# Patient Record
Sex: Female | Born: 1965 | Race: White | Hispanic: No | Smoking: Former smoker
Health system: Southern US, Community
[De-identification: ages and names within clinical notes are randomized; demographics above are authoritative.]

## PROBLEM LIST (undated history)

## (undated) DIAGNOSIS — A4902 Methicillin resistant Staphylococcus aureus infection, unspecified site: Secondary | ICD-10-CM

## (undated) DIAGNOSIS — E43 Unspecified severe protein-calorie malnutrition: Secondary | ICD-10-CM

## (undated) DIAGNOSIS — F1120 Opioid dependence, uncomplicated: Secondary | ICD-10-CM

## (undated) DIAGNOSIS — K7460 Unspecified cirrhosis of liver: Secondary | ICD-10-CM

## (undated) DIAGNOSIS — M4622 Osteomyelitis of vertebra, cervical region: Secondary | ICD-10-CM

## (undated) DIAGNOSIS — B182 Chronic viral hepatitis C: Secondary | ICD-10-CM

---

## 2011-08-29 DIAGNOSIS — B182 Chronic viral hepatitis C: Secondary | ICD-10-CM | POA: Diagnosis present

## 2020-09-15 DIAGNOSIS — U071 COVID-19: Secondary | ICD-10-CM | POA: Insufficient documentation

## 2021-07-10 ENCOUNTER — Inpatient Hospital Stay (HOSPITAL_COMMUNITY)
Admission: EM | Admit: 2021-07-10 | Discharge: 2021-07-19 | DRG: 539 | Disposition: A | Discharge status: Skilled Nursing Facility | Payer: Self-pay | Source: Skilled Nursing Facility | Attending: Internal Medicine | Admitting: Internal Medicine

## 2021-07-10 DIAGNOSIS — K766 Portal hypertension: Secondary | ICD-10-CM | POA: Diagnosis present

## 2021-07-10 DIAGNOSIS — F1990 Other psychoactive substance use, unspecified, uncomplicated: Secondary | ICD-10-CM | POA: Diagnosis present

## 2021-07-10 DIAGNOSIS — M4622 Osteomyelitis of vertebra, cervical region: Secondary | ICD-10-CM | POA: Diagnosis present

## 2021-07-10 DIAGNOSIS — Z8672 Personal history of thrombophlebitis: Secondary | ICD-10-CM

## 2021-07-10 DIAGNOSIS — R7881 Bacteremia: Secondary | ICD-10-CM | POA: Diagnosis present

## 2021-07-10 DIAGNOSIS — M8448XA Pathological fracture, other site, initial encounter for fracture: Secondary | ICD-10-CM | POA: Diagnosis present

## 2021-07-10 DIAGNOSIS — B9562 Methicillin resistant Staphylococcus aureus infection as the cause of diseases classified elsewhere: Secondary | ICD-10-CM | POA: Diagnosis present

## 2021-07-10 DIAGNOSIS — K7469 Other cirrhosis of liver: Secondary | ICD-10-CM | POA: Diagnosis present

## 2021-07-10 DIAGNOSIS — Z87891 Personal history of nicotine dependence: Secondary | ICD-10-CM

## 2021-07-10 DIAGNOSIS — R140 Abdominal distension (gaseous): Secondary | ICD-10-CM

## 2021-07-10 DIAGNOSIS — N179 Acute kidney failure, unspecified: Secondary | ICD-10-CM | POA: Diagnosis present

## 2021-07-10 DIAGNOSIS — Z66 Do not resuscitate: Secondary | ICD-10-CM | POA: Diagnosis present

## 2021-07-10 DIAGNOSIS — M4626 Osteomyelitis of vertebra, lumbar region: Principal | ICD-10-CM | POA: Diagnosis present

## 2021-07-10 DIAGNOSIS — E43 Unspecified severe protein-calorie malnutrition: Secondary | ICD-10-CM | POA: Diagnosis present

## 2021-07-10 DIAGNOSIS — K7290 Hepatic failure, unspecified without coma: Secondary | ICD-10-CM

## 2021-07-10 DIAGNOSIS — K7031 Alcoholic cirrhosis of liver with ascites: Secondary | ICD-10-CM | POA: Diagnosis present

## 2021-07-10 DIAGNOSIS — L8990 Pressure ulcer of unspecified site, unspecified stage: Secondary | ICD-10-CM | POA: Insufficient documentation

## 2021-07-10 DIAGNOSIS — M545 Low back pain, unspecified: Secondary | ICD-10-CM

## 2021-07-10 DIAGNOSIS — Z791 Long term (current) use of non-steroidal anti-inflammatories (NSAID): Secondary | ICD-10-CM

## 2021-07-10 DIAGNOSIS — L89151 Pressure ulcer of sacral region, stage 1: Secondary | ICD-10-CM | POA: Diagnosis not present

## 2021-07-10 DIAGNOSIS — Z79899 Other long term (current) drug therapy: Secondary | ICD-10-CM

## 2021-07-10 DIAGNOSIS — K6812 Psoas muscle abscess: Secondary | ICD-10-CM | POA: Diagnosis present

## 2021-07-10 DIAGNOSIS — M4640 Discitis, unspecified, site unspecified: Secondary | ICD-10-CM | POA: Diagnosis present

## 2021-07-10 DIAGNOSIS — K7460 Unspecified cirrhosis of liver: Secondary | ICD-10-CM

## 2021-07-10 DIAGNOSIS — Z6821 Body mass index (BMI) 21.0-21.9, adult: Secondary | ICD-10-CM

## 2021-07-10 DIAGNOSIS — M4649 Discitis, unspecified, multiple sites in spine: Secondary | ICD-10-CM | POA: Diagnosis present

## 2021-07-10 DIAGNOSIS — R188 Other ascites: Secondary | ICD-10-CM

## 2021-07-10 DIAGNOSIS — Z20822 Contact with and (suspected) exposure to covid-19: Secondary | ICD-10-CM | POA: Diagnosis present

## 2021-07-10 DIAGNOSIS — D61818 Other pancytopenia: Secondary | ICD-10-CM | POA: Diagnosis present

## 2021-07-10 DIAGNOSIS — B182 Chronic viral hepatitis C: Secondary | ICD-10-CM | POA: Diagnosis present

## 2021-07-10 DIAGNOSIS — R6510 Systemic inflammatory response syndrome (SIRS) of non-infectious origin without acute organ dysfunction: Secondary | ICD-10-CM

## 2021-07-10 DIAGNOSIS — D649 Anemia, unspecified: Secondary | ICD-10-CM | POA: Diagnosis present

## 2021-07-10 DIAGNOSIS — F319 Bipolar disorder, unspecified: Secondary | ICD-10-CM | POA: Diagnosis present

## 2021-07-10 DIAGNOSIS — E871 Hypo-osmolality and hyponatremia: Secondary | ICD-10-CM | POA: Diagnosis present

## 2021-07-10 HISTORY — DX: Opioid dependence, uncomplicated: F11.20

## 2021-07-10 HISTORY — DX: Methicillin resistant Staphylococcus aureus infection, unspecified site: A49.02

## 2021-07-10 HISTORY — DX: Unspecified cirrhosis of liver: K74.60

## 2021-07-10 HISTORY — DX: Osteomyelitis of vertebra, cervical region: M46.22

## 2021-07-10 HISTORY — DX: Chronic viral hepatitis C: B18.2

## 2021-07-10 HISTORY — DX: Unspecified severe protein-calorie malnutrition: E43

## 2021-07-11 ENCOUNTER — Emergency Department (HOSPITAL_COMMUNITY): Payer: Self-pay

## 2021-07-11 ENCOUNTER — Encounter (HOSPITAL_COMMUNITY): Payer: Self-pay

## 2021-07-11 ENCOUNTER — Other Ambulatory Visit: Payer: Self-pay

## 2021-07-11 ENCOUNTER — Inpatient Hospital Stay (HOSPITAL_COMMUNITY): Payer: Self-pay

## 2021-07-11 DIAGNOSIS — A419 Sepsis, unspecified organism: Secondary | ICD-10-CM

## 2021-07-11 DIAGNOSIS — M4642 Discitis, unspecified, cervical region: Secondary | ICD-10-CM

## 2021-07-11 DIAGNOSIS — B9562 Methicillin resistant Staphylococcus aureus infection as the cause of diseases classified elsewhere: Secondary | ICD-10-CM

## 2021-07-11 DIAGNOSIS — N179 Acute kidney failure, unspecified: Secondary | ICD-10-CM

## 2021-07-11 DIAGNOSIS — F1990 Other psychoactive substance use, unspecified, uncomplicated: Secondary | ICD-10-CM

## 2021-07-11 DIAGNOSIS — M4646 Discitis, unspecified, lumbar region: Secondary | ICD-10-CM

## 2021-07-11 DIAGNOSIS — R7881 Bacteremia: Secondary | ICD-10-CM

## 2021-07-11 DIAGNOSIS — K6812 Psoas muscle abscess: Secondary | ICD-10-CM

## 2021-07-11 DIAGNOSIS — K7031 Alcoholic cirrhosis of liver with ascites: Secondary | ICD-10-CM

## 2021-07-11 DIAGNOSIS — R6520 Severe sepsis without septic shock: Secondary | ICD-10-CM

## 2021-07-11 DIAGNOSIS — M4640 Discitis, unspecified, site unspecified: Secondary | ICD-10-CM | POA: Diagnosis present

## 2021-07-11 HISTORY — DX: Other psychoactive substance use, unspecified, uncomplicated: F19.90

## 2021-07-11 HISTORY — DX: Methicillin resistant Staphylococcus aureus infection as the cause of diseases classified elsewhere: B95.62

## 2021-07-11 LAB — CBC WITH DIFFERENTIAL/PLATELET
Abs Immature Granulocytes: 0.02 10*3/uL (ref 0.00–0.07)
Basophils Absolute: 0 10*3/uL (ref 0.0–0.1)
Basophils Relative: 1 %
Eosinophils Absolute: 0.1 10*3/uL (ref 0.0–0.5)
Eosinophils Relative: 3 %
HCT: 24.1 % — ABNORMAL LOW (ref 36.0–46.0)
Hemoglobin: 7.7 g/dL — ABNORMAL LOW (ref 12.0–15.0)
Immature Granulocytes: 1 %
Lymphocytes Relative: 22 %
Lymphs Abs: 0.6 10*3/uL — ABNORMAL LOW (ref 0.7–4.0)
MCH: 33.5 pg (ref 26.0–34.0)
MCHC: 32 g/dL (ref 30.0–36.0)
MCV: 104.8 fL — ABNORMAL HIGH (ref 80.0–100.0)
Monocytes Absolute: 0.2 10*3/uL (ref 0.1–1.0)
Monocytes Relative: 6 %
Neutro Abs: 1.9 10*3/uL (ref 1.7–7.7)
Neutrophils Relative %: 67 %
Platelets: 120 10*3/uL — ABNORMAL LOW (ref 150–400)
RBC: 2.3 MIL/uL — ABNORMAL LOW (ref 3.87–5.11)
RDW: 20.9 % — ABNORMAL HIGH (ref 11.5–15.5)
WBC: 2.9 10*3/uL — ABNORMAL LOW (ref 4.0–10.5)
nRBC: 0 % (ref 0.0–0.2)

## 2021-07-11 LAB — FOLATE: Folate: 16.4 ng/mL (ref >5.9)

## 2021-07-11 LAB — URINALYSIS, ROUTINE W REFLEX MICROSCOPIC
Bacteria, UA: NONE SEEN
Bilirubin Urine: NEGATIVE
Glucose, UA: NEGATIVE mg/dL
Hgb urine dipstick: NEGATIVE
Ketones, ur: NEGATIVE mg/dL
Leukocytes,Ua: NEGATIVE
Nitrite: NEGATIVE
Protein, ur: NEGATIVE mg/dL
Specific Gravity, Urine: 1.018 (ref 1.005–1.030)
pH: 7 (ref 5.0–8.0)

## 2021-07-11 LAB — VITAMIN B12: Vitamin B-12: 836 pg/mL (ref 180–914)

## 2021-07-11 LAB — COMPREHENSIVE METABOLIC PANEL
ALT: 25 U/L (ref 0–44)
AST: 51 U/L — ABNORMAL HIGH (ref 15–41)
Albumin: 2.4 g/dL — ABNORMAL LOW (ref 3.5–5.0)
Alkaline Phosphatase: 77 U/L (ref 38–126)
Anion gap: 7 (ref 5–15)
BUN: 32 mg/dL — ABNORMAL HIGH (ref 6–20)
CO2: 20 mmol/L — ABNORMAL LOW (ref 22–32)
Calcium: 8.3 mg/dL — ABNORMAL LOW (ref 8.9–10.3)
Chloride: 106 mmol/L (ref 98–111)
Creatinine, Ser: 1.58 mg/dL — ABNORMAL HIGH (ref 0.44–1.00)
GFR, Estimated: 38 mL/min — ABNORMAL LOW (ref >60)
Glucose, Bld: 107 mg/dL — ABNORMAL HIGH (ref 70–99)
Potassium: 4.3 mmol/L (ref 3.5–5.1)
Sodium: 133 mmol/L — ABNORMAL LOW (ref 135–145)
Total Bilirubin: 2.4 mg/dL — ABNORMAL HIGH (ref 0.3–1.2)
Total Protein: 8.7 g/dL — ABNORMAL HIGH (ref 6.5–8.1)

## 2021-07-11 LAB — LACTIC ACID, PLASMA: Lactic Acid, Venous: 1.7 mmol/L (ref 0.5–1.9)

## 2021-07-11 LAB — AMMONIA: Ammonia: 109 umol/L — ABNORMAL HIGH (ref 9–35)

## 2021-07-11 LAB — PROTIME-INR
INR: 1.4 — ABNORMAL HIGH (ref 0.8–1.2)
Prothrombin Time: 17.5 seconds — ABNORMAL HIGH (ref 11.4–15.2)

## 2021-07-11 LAB — LIPASE, BLOOD: Lipase: 46 U/L (ref 11–51)

## 2021-07-11 LAB — RESP PANEL BY RT-PCR (FLU A&B, COVID) ARPGX2
Influenza A by PCR: NEGATIVE
Influenza B by PCR: NEGATIVE
SARS Coronavirus 2 by RT PCR: NEGATIVE

## 2021-07-11 LAB — HIV ANTIBODY (ROUTINE TESTING W REFLEX): HIV Screen 4th Generation wRfx: NONREACTIVE

## 2021-07-11 LAB — APTT: aPTT: 35 seconds (ref 24–36)

## 2021-07-11 IMAGING — DX DG CHEST 1V PORT
1 series · 1 of 1 positions shown · non-contrast
Comparison: None.

CLINICAL DATA: Chronic lower back pain.

EXAM:
PORTABLE CHEST 1 VIEW

[chest ap]
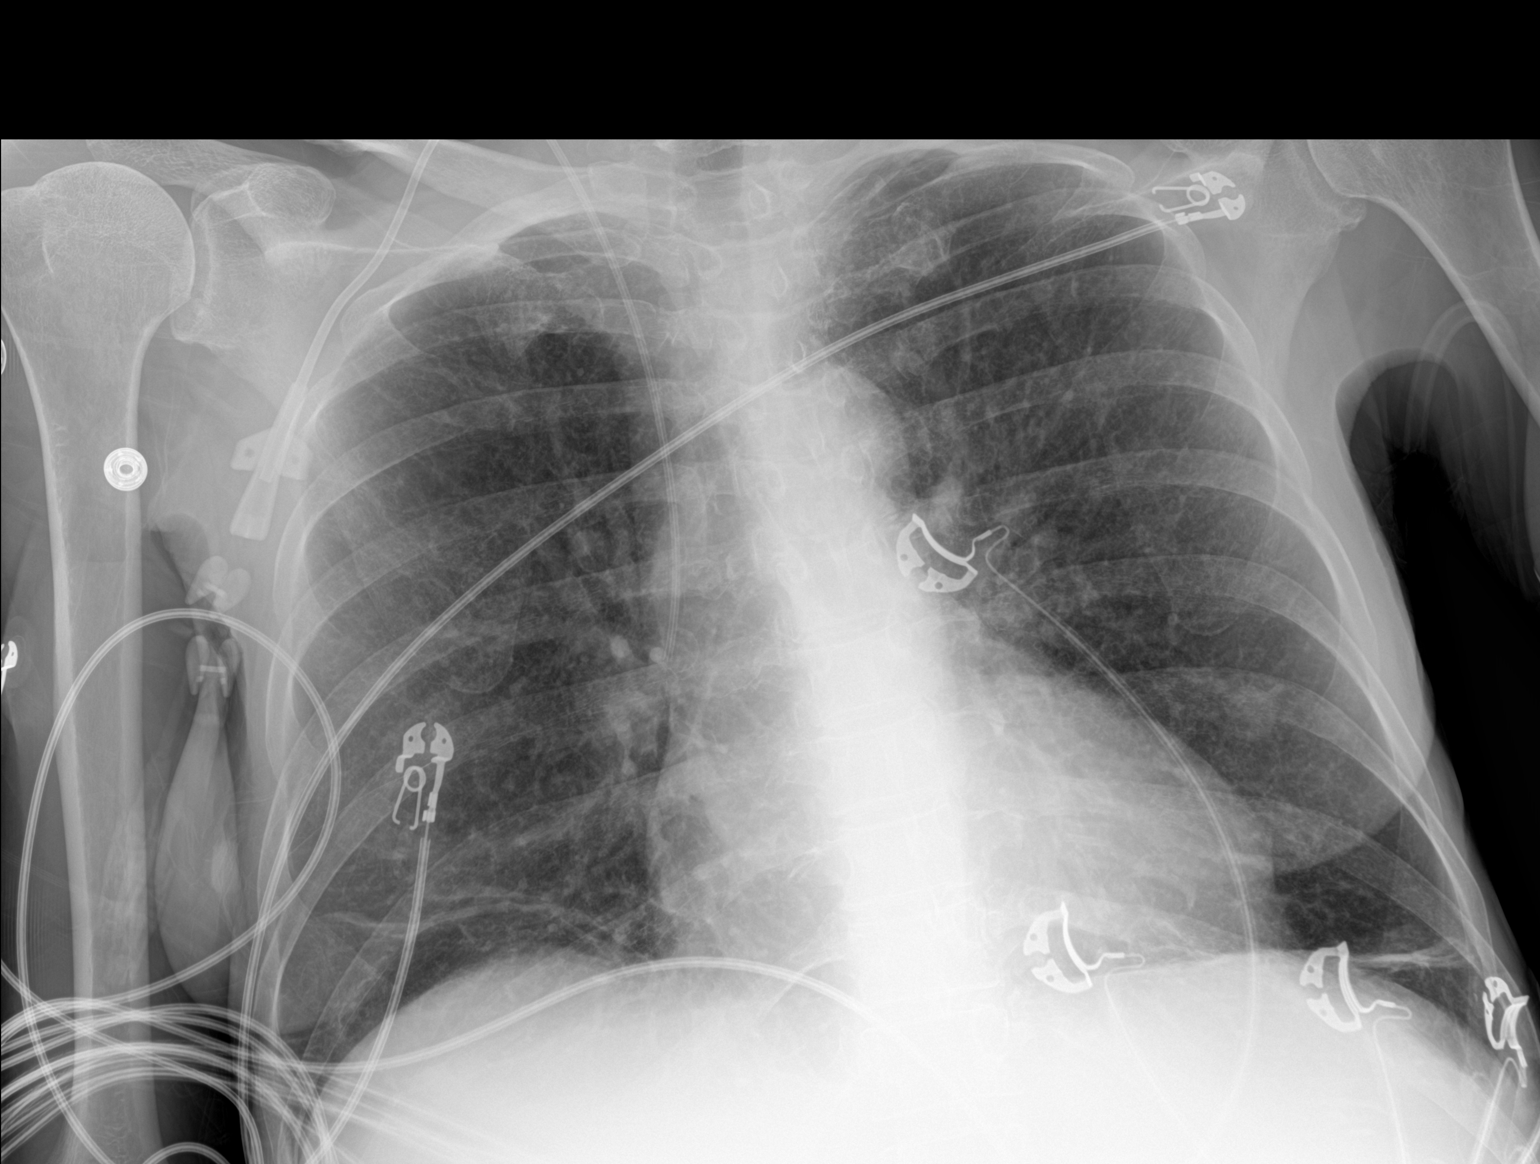

[1 of 1 positions shown; findings below may reference images not displayed]

FINDINGS: A right-sided venous catheter is seen with its distal tip noted at
the junction of the superior vena cava and right atrium. Mild linear
atelectasis is present within the bilateral lung bases. There is no
evidence of a pleural effusion or pneumothorax. The heart size and
mediastinal contours are within normal limits. The visualized
skeletal structures are unremarkable.
IMPRESSION: 1. Right-sided venous catheter positioning, as described above.
2. Mild bibasilar linear atelectasis.

## 2021-07-11 IMAGING — MR MR CERVICAL SPINE WO/W CM
6 of 9 series · 30 of 48 positions shown · IV contrast (5 ML GDAVIST)
Comparison: Portable chest radiograph same date. No previous
outside imaging or reports available.

CLINICAL DATA: Mid to low back pain. Reported findings of
discitis/osteomyelitis on outside MRI [DATE]. history of drug
abuse.

EXAM:
MRI CERVICAL, THORACIC AND LUMBAR SPINE WITHOUT AND WITH CONTRAST
TECHNIQUE: Multiplanar and multiecho pulse sequences of the cervical spine, to
include the craniocervical junction and cervicothoracic junction,
and thoracic and lumbar spine, were obtained without and with
intravenous contrast.
CONTRAST:  5mL GADAVIST GADOBUTROL 1 MMOL/ML IV SOLN

[Series 5: T1 · sagittal · 3.0mm · 0.69mm/px · 4 of 15 slices shown (1 of 2)]
[im 1/15]
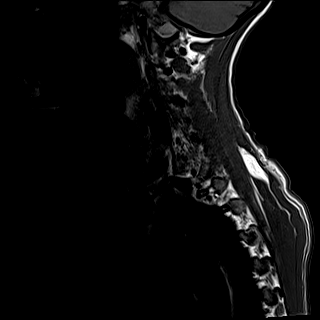
[im 5/15]
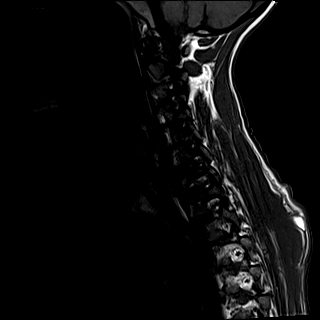
[im 10/15]
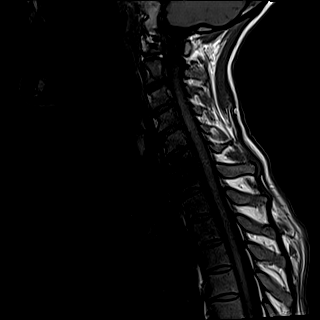
[im 15/15]
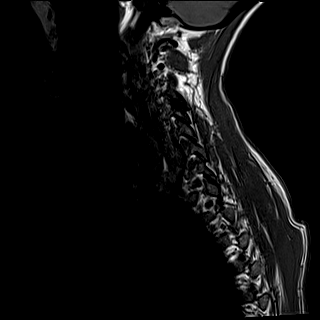

[Series 8: T1 · axial · 3.0mm · 0.35mm/px · z∈[-88,-2]mm · 7 of 27 slices shown (2 of 2)]
[im 1/27]
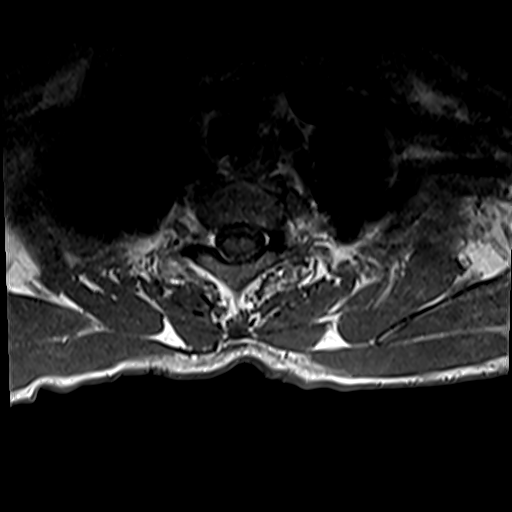
[im 5/27]
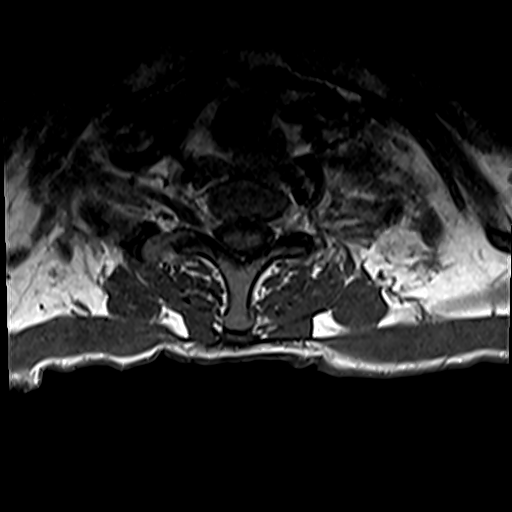
[im 9/27]
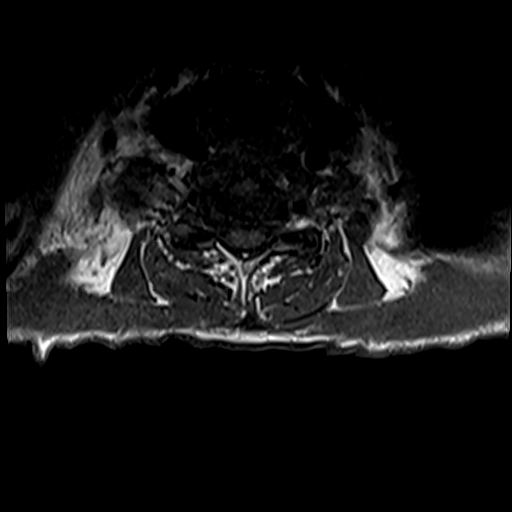
[im 14/27]
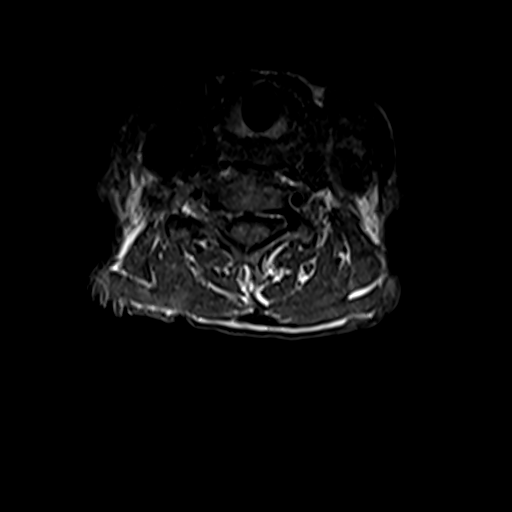
[im 18/27]
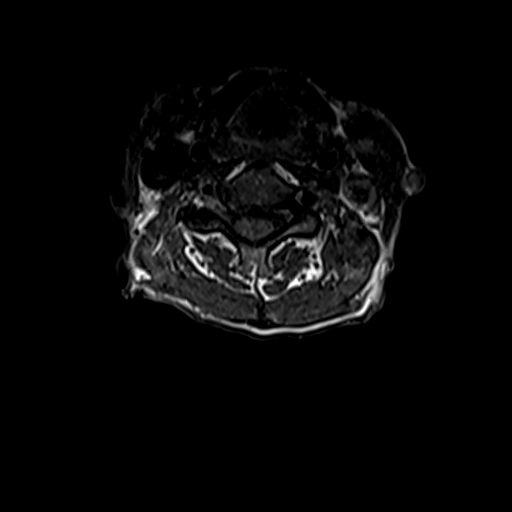
[im 22/27]
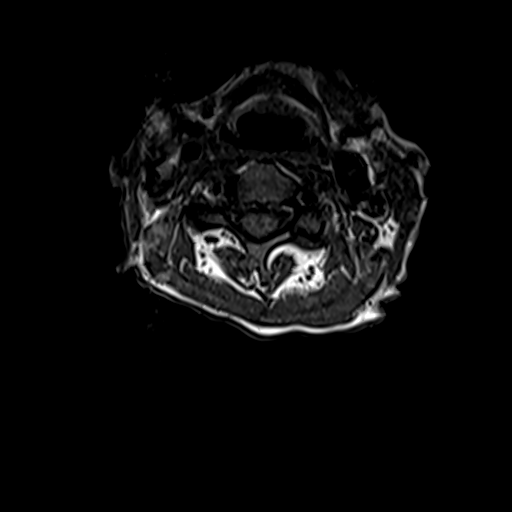
[im 27/27]
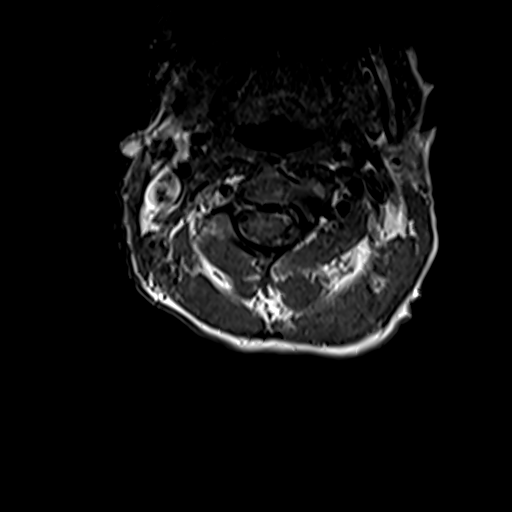

[Series 9: T2 · sagittal · 3.0mm · 0.69mm/px · 4 of 15 slices shown (1 of 2)]
[im 1/15]
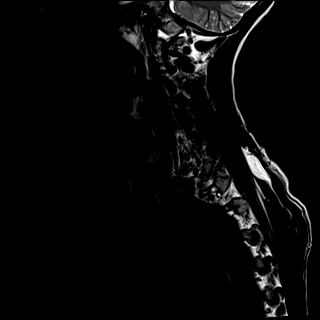
[im 5/15]
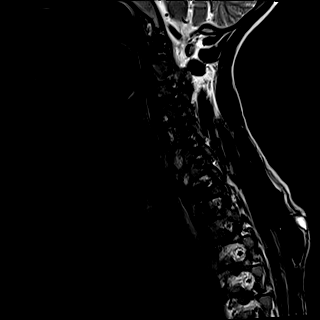
[im 10/15]
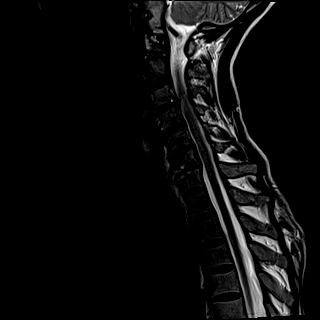
[im 15/15]
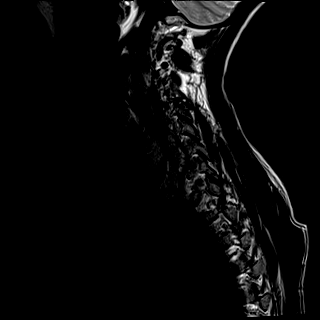

[Series 10: T2 · axial · 3.0mm · 0.70mm/px · z∈[-88,-2]mm · 7 of 27 slices shown (2 of 2)]
[im 1/27]
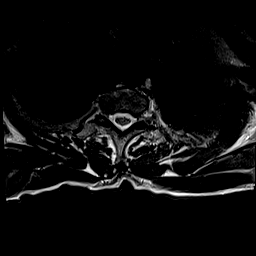
[im 5/27]
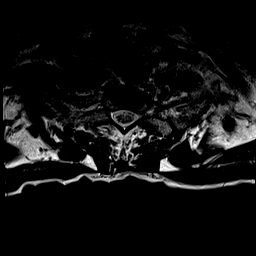
[im 9/27]
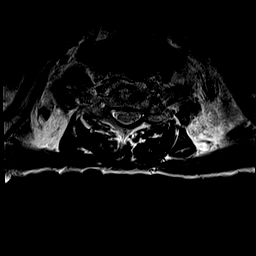
[im 14/27]
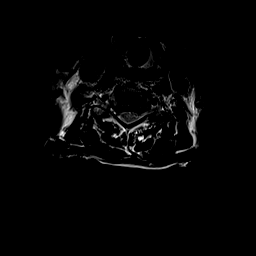
[im 18/27]
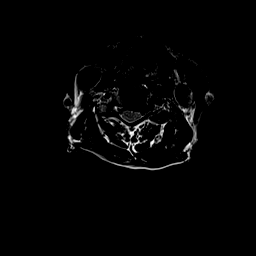
[im 22/27]
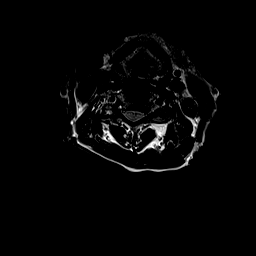
[im 27/27]
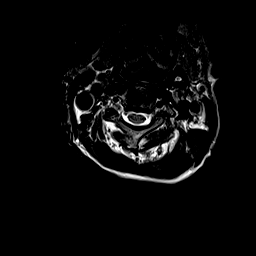

[Series 11: T2 post-contrast · sagittal · 3.0mm · 0.69mm/px · 4 of 15 slices shown]
[im 1/15]
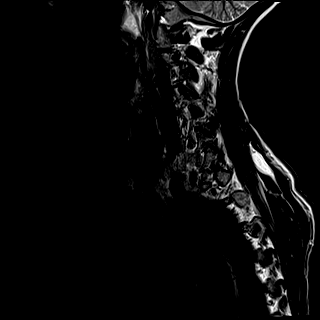
[im 5/15]
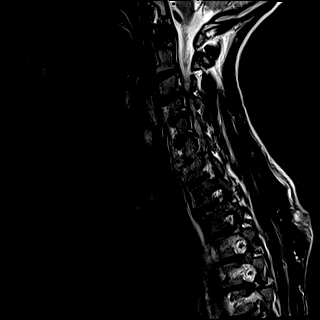
[im 10/15]
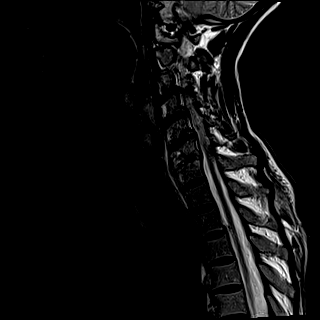
[im 15/15]
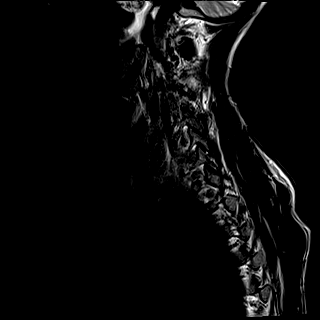

[Series 12: T1 fat-sat post-contrast · sagittal · 3.0mm · 0.69mm/px · 4 of 15 slices shown]
[im 1/15]
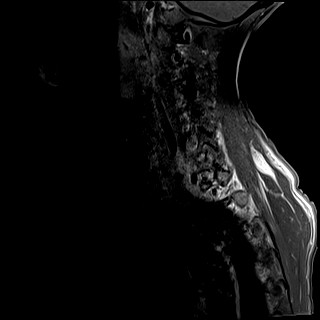
[im 5/15]
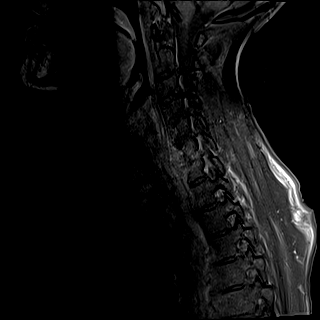
[im 10/15]
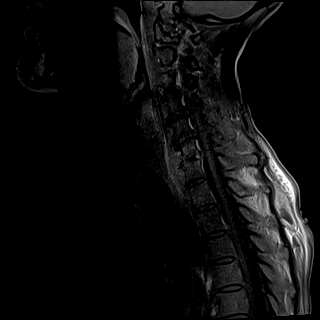
[im 15/15]
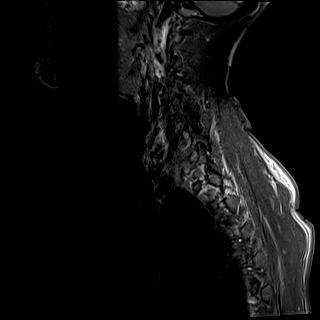

[30 of 48 positions shown; findings below may reference images not displayed]

FINDINGS: MRI CERVICAL SPINE FINDINGS

Alignment: Straightening without focal angulation or significant
listhesis.

Vertebrae: There is mild T2 hyperintensity within the C6-7 disc.
There is adjacent endplate T2 hyperintensity, T1 hypointensity and
diffuse marrow enhancement within the C6 and C7 vertebral bodies
consistent with osteomyelitis. No significant pathologic fracture.
No other significant marrow signal abnormalities in the cervical
spine.

Cord: Normal in signal and caliber. No abnormal intradural
enhancement.

Posterior Fossa, vertebral arteries, paraspinal tissues: The
visualized craniocervical junction appears normal. There are
bilateral vertebral artery flow voids. There are anterior paraspinal
inflammatory changes within the prevertebral soft tissues at C6-7
without focal fluid collection. No epidural fluid collections are
seen.

Disc levels:

Multilevel cervical spondylosis with disc bulging, uncinate spurring
and facet hypertrophy. There is no cord compression, although
multilevel mild-to-moderate foraminal narrowing is present from C3-4
through C6-7. As above, findings of discitis and early osteomyelitis
at C6-7 without associated epidural fluid collection. There are
anterior paraspinal inflammatory changes with heterogeneous
enhancement, but no focal fluid collection.

MRI THORACIC SPINE FINDINGS

Alignment:  Normal.

Vertebrae: No evidence of acute fracture, discitis or osteomyelitis
within the thoracic spine.

Cord: Normal in signal and caliber. No abnormal intradural
enhancement.

Paraspinal and other soft tissues: The thoracic paraspinal soft
tissues appear unremarkable. Trace bilateral pleural effusions.
Patchy pulmonary opacities are noted, suboptimally evaluated by MRI,
but likely reflecting atelectasis or edema based on earlier chest
radiographs.

Disc levels:

The thoracic discs appear normal. No disc herniation, spinal
stenosis or nerve root encroachment identified in the thoracic
spine.

MRI LUMBAR SPINE FINDINGS

Segmentation:  There are 5 lumbar type vertebral bodies.

Alignment: Trace retrolisthesis at L1-2 and anterolisthesis at
L5-S1.

Vertebrae: There are changes of advanced discitis and osteomyelitis
at L1-2. There is endplate destruction, loss of vertebral body
height and diffuse marrow enhancement throughout the L1 and L2
vertebral bodies. The intervening disc demonstrates heterogeneous T2
hyperintensity and enhancement. No additional levels of
discitis/osteomyelitis identified. No definite infected facet joints
are seen.

Conus medullaris and cauda equina: Conus extends to the L1-2 level.
There is mass effect on the thecal sac by anterior epidural
inflammation at L1-2, but no gross conus compression or abnormal
intradural enhancement.

Paraspinal and other soft tissues: Extensive paraspinal inflammatory
changes at L1-2 with a small peripherally enhancing fluid collection
within the right psoas muscle adjacent to the L1 vertebral body,
measuring approximately 2.2 x 0.8 x 1.5 cm. No other focal
paraspinal fluid collections are identified. There is anterior
epidural inflammation at L1-2 extending into both foramina. No
epidural fluid collection identified. The spleen is incompletely
imaged, although appears moderately enlarged. There is generalized
soft tissue edema with evidence of at least moderate ascites.

Disc levels:

T12-L1: Normal interspace.

L1-2: Advanced changes of diskitis and osteomyelitis with annular
disc bulging and extensive anterior epidural inflammation extending
into both foramina. There is moderate mass effect on the thecal sac
with moderate foraminal narrowing bilaterally, worse on the left.

L2-3: Normal interspace.

L3-4: Normal interspace.

L4-5: Disc height and hydration are maintained. Mild facet and
ligamentous hypertrophy. No significant spinal stenosis.

L5-S1: Mild disc bulging with endplate osteophytes asymmetric to the
right. Mild bilateral facet hypertrophy. Mild right foraminal
narrowing.
IMPRESSION: 1. Findings consistent with discitis and osteomyelitis at C6-7. No
associated cervical paraspinal or epidural abscess or mass effect on
the cord.
2. Advanced discitis and osteomyelitis at L1-2 with associated
pathologic fracture and extensive paraspinal and epidural
inflammatory changes. There is a small probable abscess within the
right psoas muscle and mass effect on the thecal sac and both neural
foramina. No cord compression or drainable epidural fluid
collections identified.
3. No significant thoracic spine findings.
4. Multilevel cervical spondylosis as described with foraminal
narrowing bilaterally from C3-4 through C6-7 due to spondylosis.
5. Anasarca with generalized soft tissue edema, ascites and small
bilateral pleural effusions.

## 2021-07-11 IMAGING — US US ABDOMEN COMPLETE
1 series · 15 of 25 positions shown · non-contrast
Comparison: None.

CLINICAL DATA: Liver failure.  Cirrhosis.

EXAM:
ABDOMEN ULTRASOUND COMPLETE

[Series 1: us abdomen complete mc & wl · 15 of 89 slices shown]
[im 1/89]
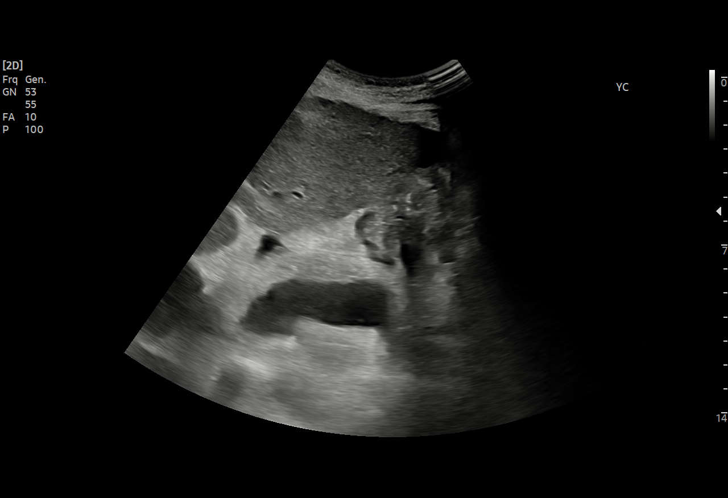
[im 8/89]
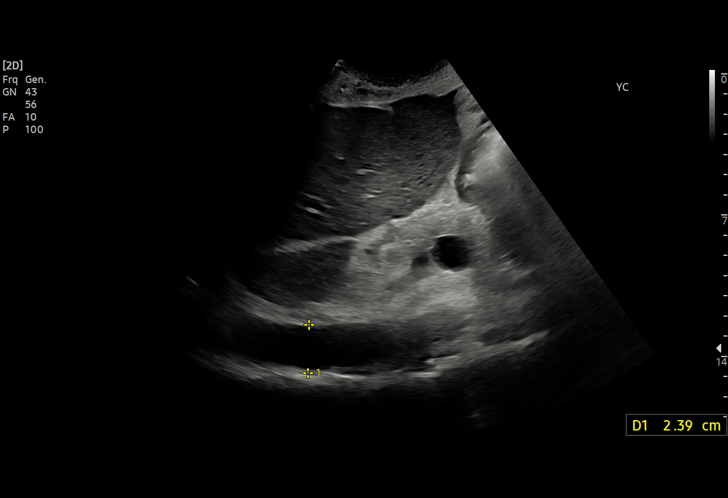
[im 15/89]
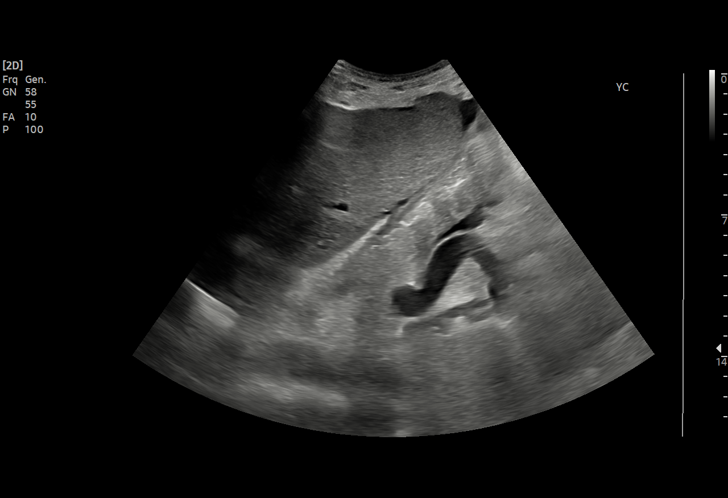
[im 19/89]
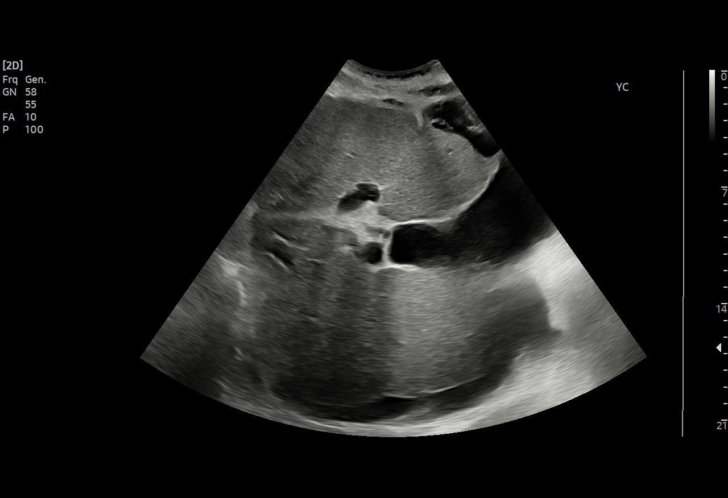
[im 26/89]
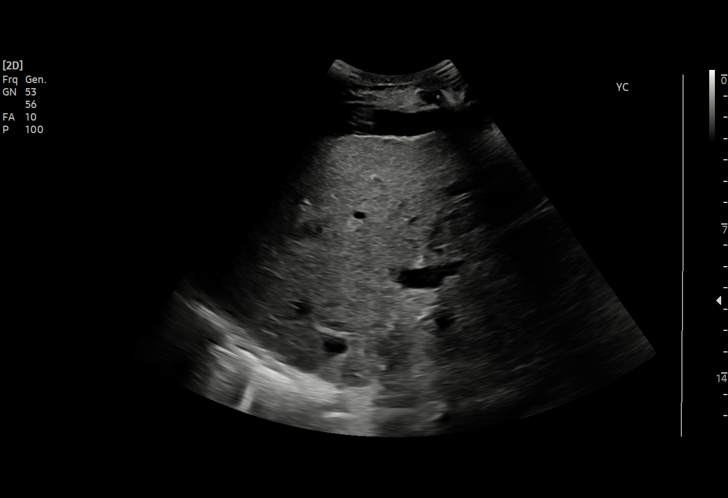
[im 34/89]
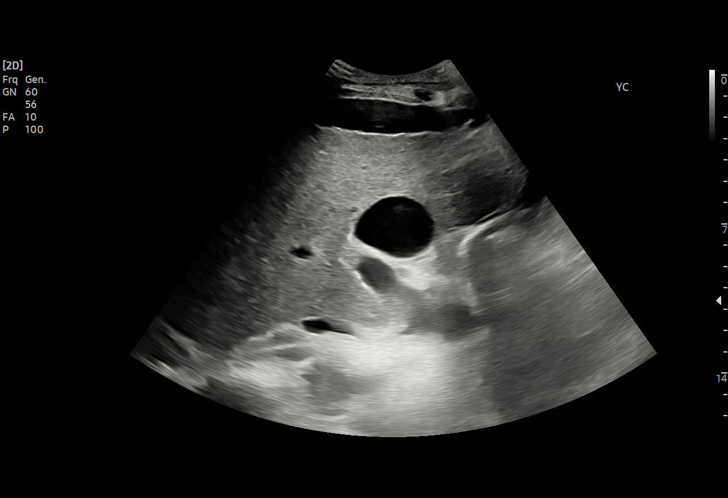
[im 37/89]
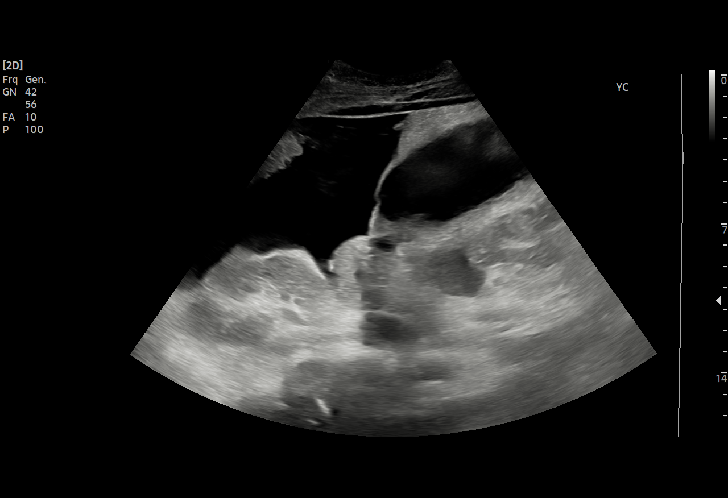
[im 45/89]
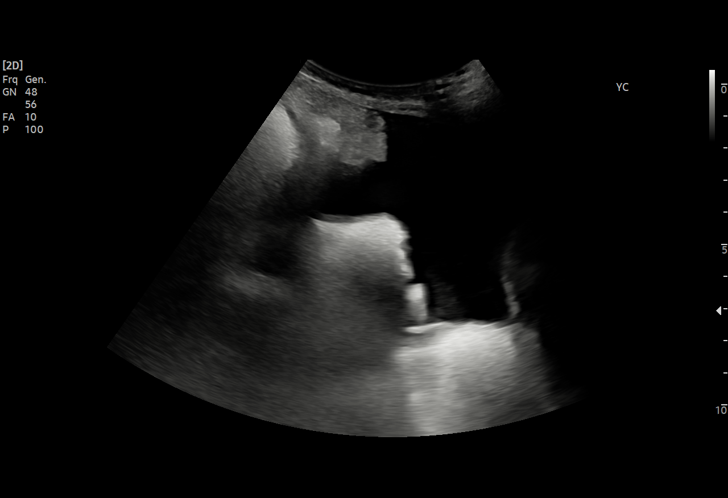
[im 52/89]
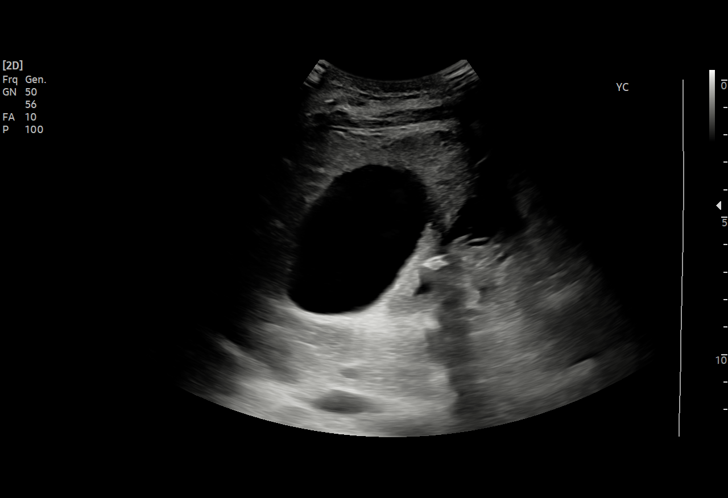
[im 56/89]
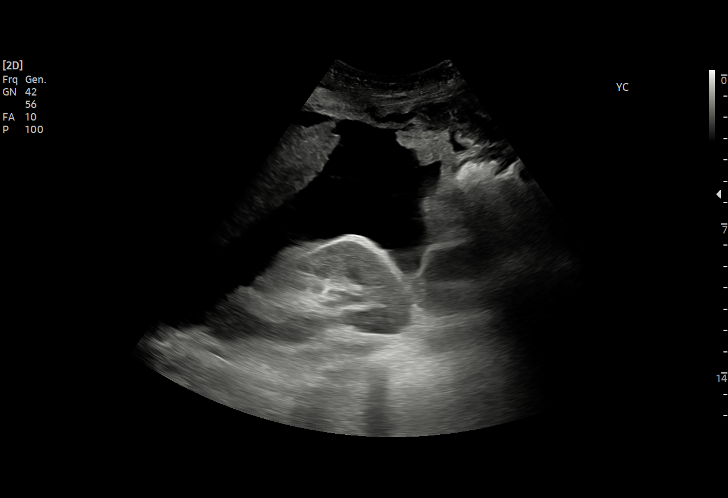
[im 63/89]
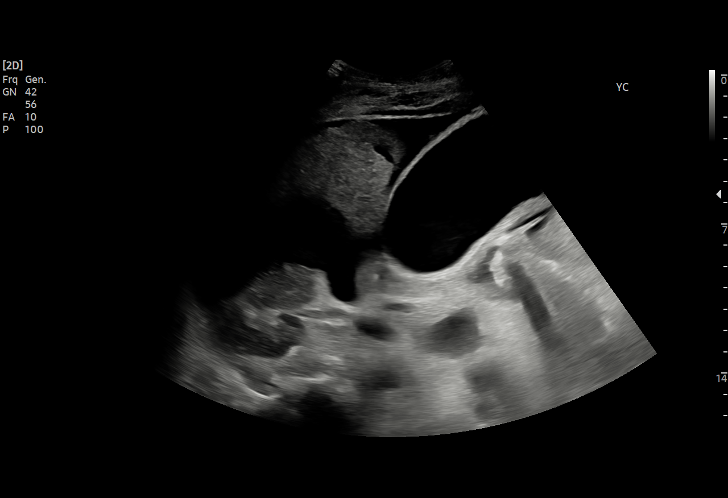
[im 70/89]
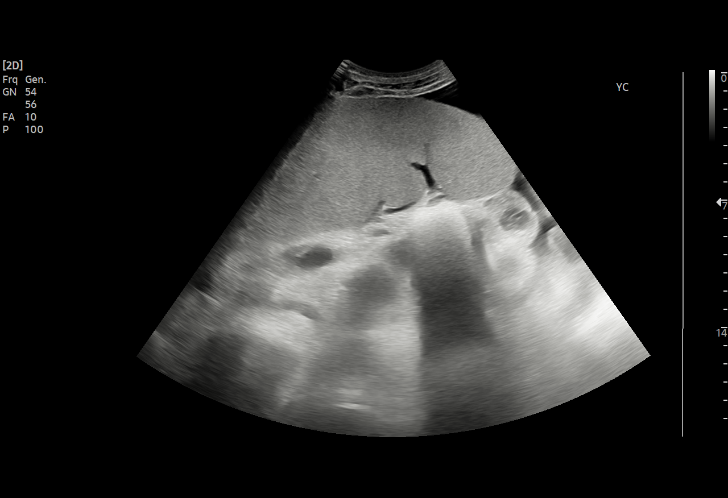
[im 74/89]
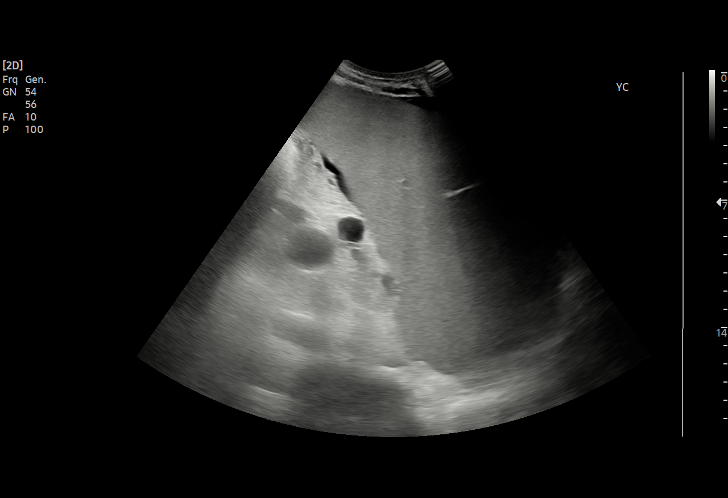
[im 81/89]
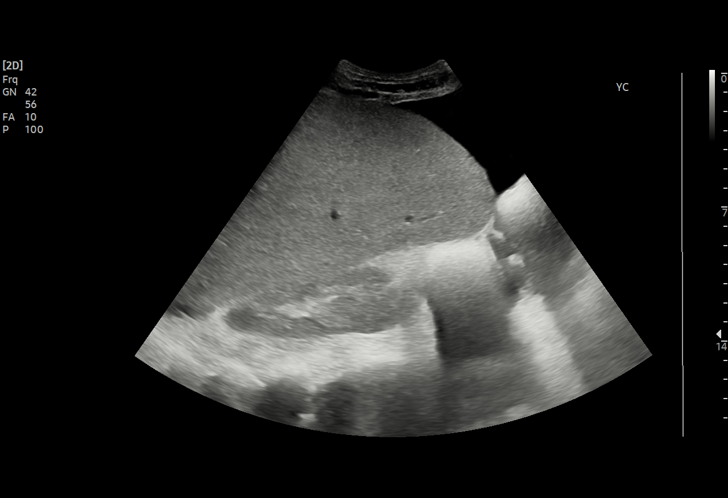
[im 89/89]
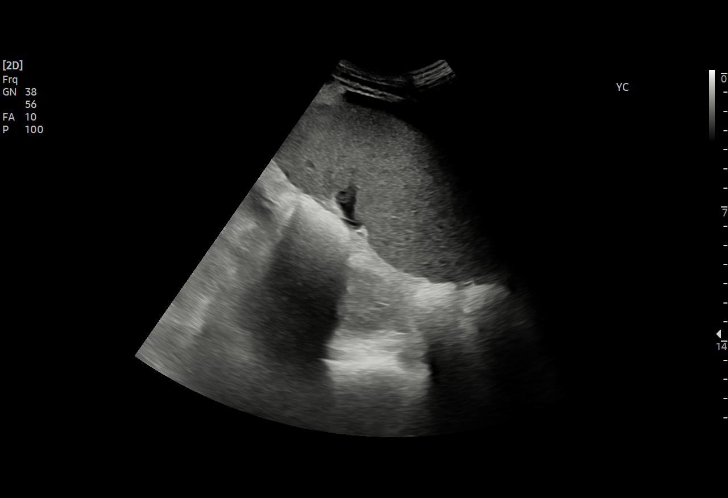

[15 of 25 positions shown; findings below may reference images not displayed]

FINDINGS: Gallbladder: No gallstones or wall thickening visualized. No
sonographic Murphy sign noted by sonographer.

Common bile duct: Diameter: 5 mm

Liver: Cirrhosis. Portal vein is patent on color Doppler imaging
with normal direction of blood flow towards the liver.

IVC: No abnormality visualized.

Pancreas: Visualized portion unremarkable.

Spleen: Splenomegaly measuring 19 cm in length.

Right Kidney: Length: 10 cm. Echogenicity within normal limits. No
mass or hydronephrosis visualized.

Left Kidney: Length: 10 cm. Echogenicity within normal limits. No
mass or hydronephrosis visualized.

Abdominal aorta: No aneurysm visualized.

Other findings: There is a small ascites.
IMPRESSION: 1. Cirrhosis with evidence of portal hypertension, splenomegaly, and
ascites.
2. Patent main portal vein with hepatopetal flow.

## 2021-07-11 IMAGING — MR MR THORACIC SPINE WO/W CM
8 of 10 series · 31 of 48 positions shown · IV contrast (gadavist)
Comparison: Portable chest radiograph same date. No previous
outside imaging or reports available.

CLINICAL DATA: Mid to low back pain. Reported findings of
discitis/osteomyelitis on outside MRI [DATE]. history of drug
abuse.

EXAM:
MRI CERVICAL, THORACIC AND LUMBAR SPINE WITHOUT AND WITH CONTRAST
TECHNIQUE: Multiplanar and multiecho pulse sequences of the cervical spine, to
include the craniocervical junction and cervicothoracic junction,
and thoracic and lumbar spine, were obtained without and with
intravenous contrast.
CONTRAST:  5mL GADAVIST GADOBUTROL 1 MMOL/ML IV SOLN

[Series 16: T1 · sagittal · 4.0mm · 1.72mm/px · 2 of 12 slices shown (1 of 3)]
[im 1/12]
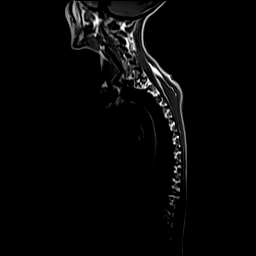
[im 12/12]
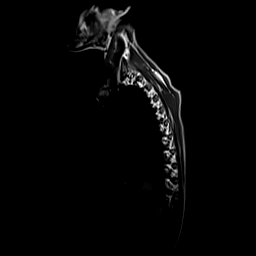

[Series 17: STIR · sagittal · 3.0mm · 1.00mm/px · 2 of 18 slices shown]
[im 1/18]
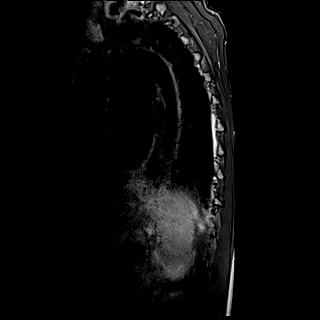
[im 5/18]
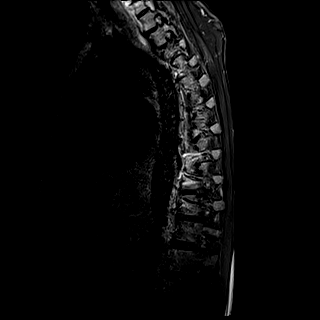

[Series 18: T1 · sagittal · 3.0mm · 1.00mm/px · 4 of 15 slices shown (2 of 3)]
[im 1/15]
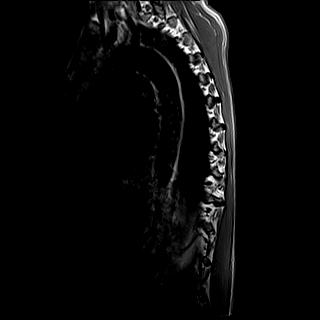
[im 5/15]
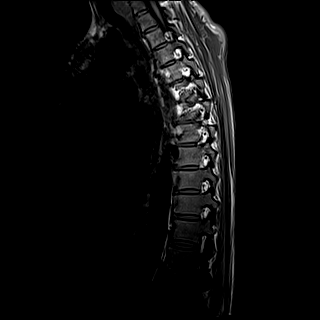
[im 10/15]
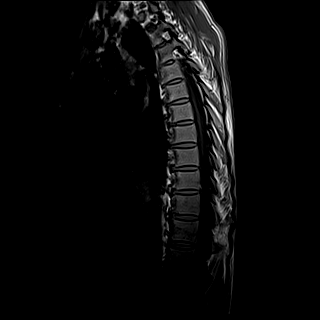
[im 15/15]
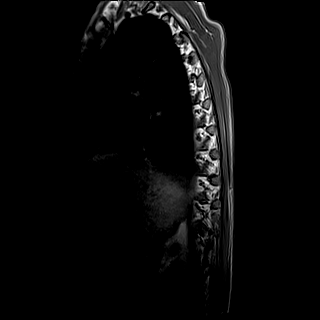

[Series 19: T2 · sagittal · 3.0mm · 0.83mm/px · 5 of 18 slices shown (1 of 2)]
[im 1/18]
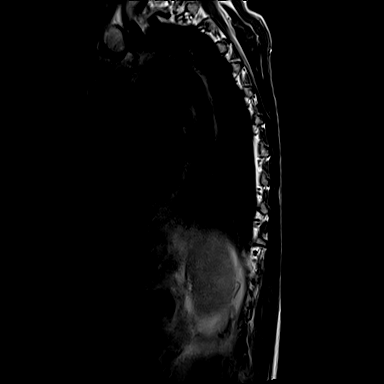
[im 5/18]
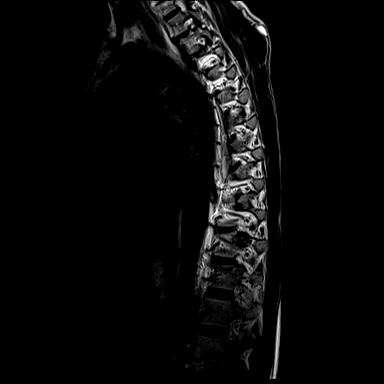
[im 9/18]
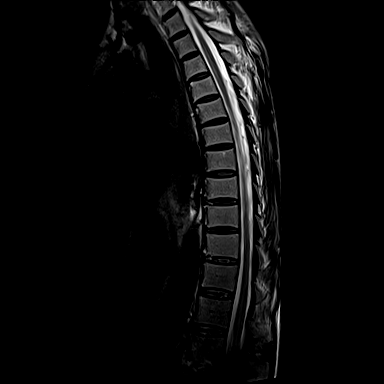
[im 13/18]
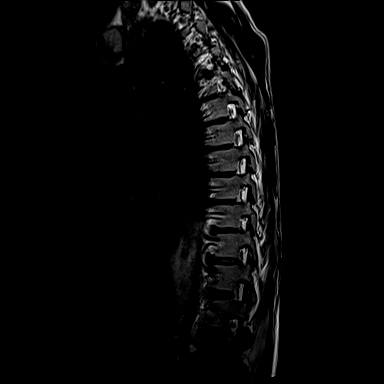
[im 18/18]
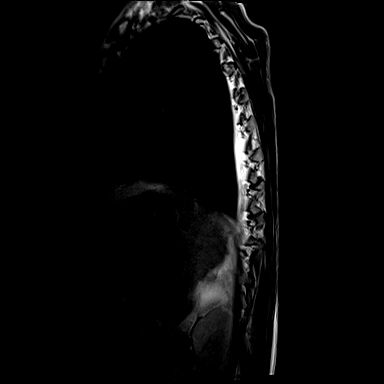

[Series 20: T2 · axial · 4.0mm · 0.78mm/px · z∈[-291,-86]mm · 4 of 15 slices shown (2 of 2)]
[im 1/15]
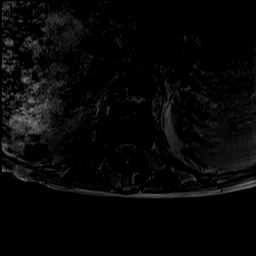
[im 5/15]
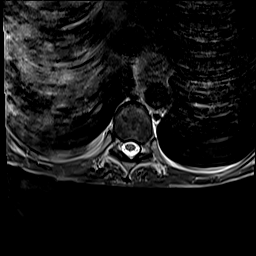
[im 10/15]
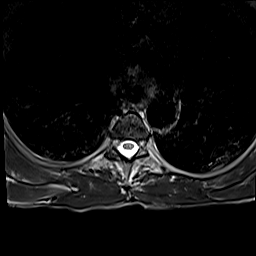
[im 15/15]
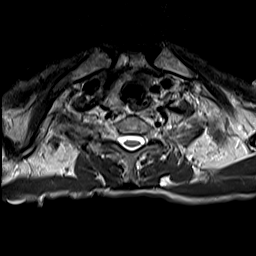

[Series 22: T1 · axial · 4.0mm · 0.39mm/px · z∈[-291,-86]mm · 4 of 15 slices shown (3 of 3)]
[im 1/15]
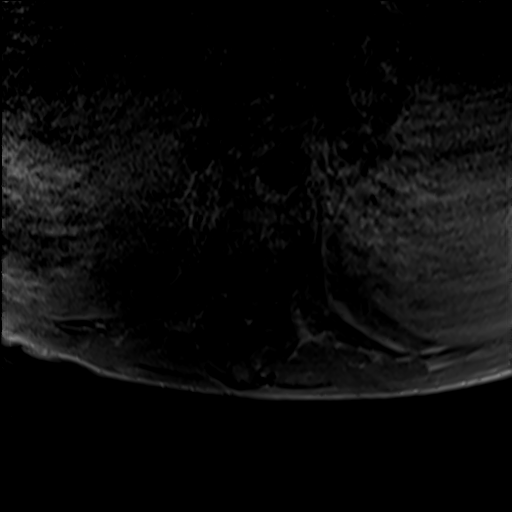
[im 5/15]
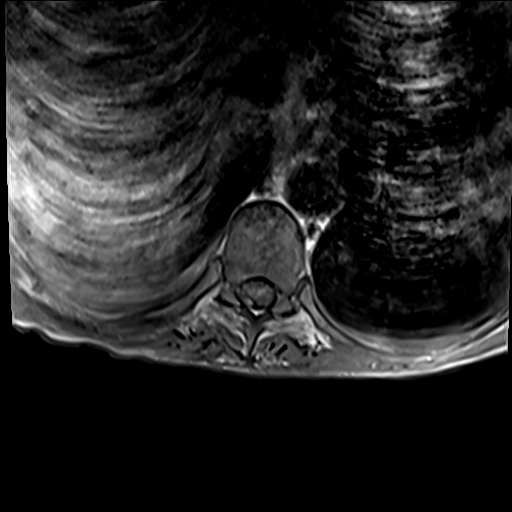
[im 10/15]
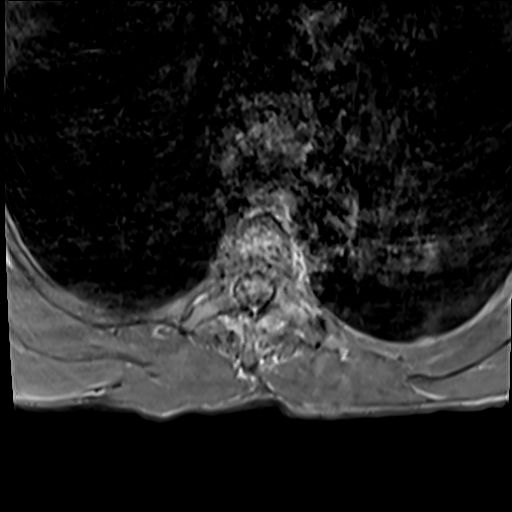
[im 15/15]
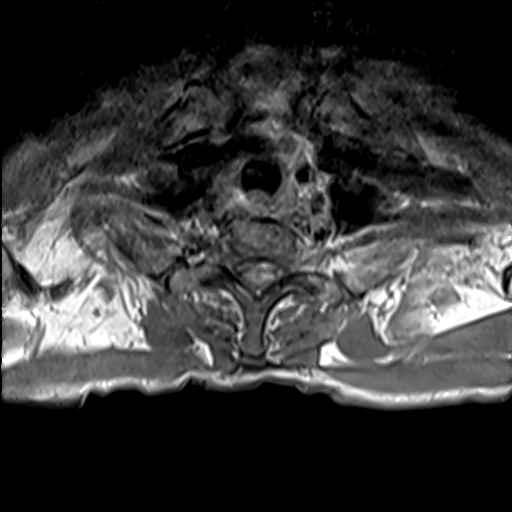

[Series 23: T2 post-contrast · sagittal · 3.0mm · 0.83mm/px · 5 of 18 slices shown]
[im 1/18]
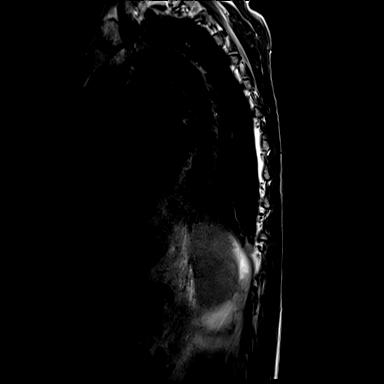
[im 5/18]
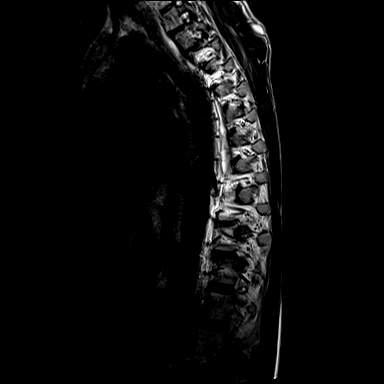
[im 9/18]
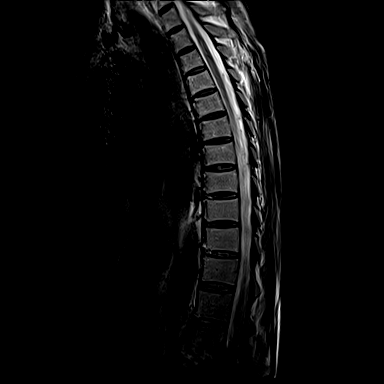
[im 13/18]
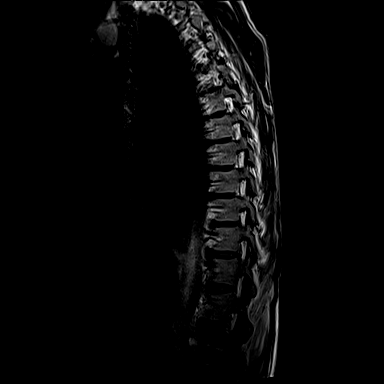
[im 18/18]
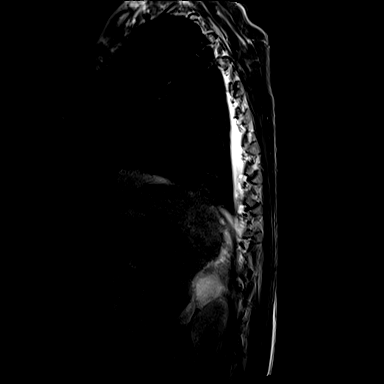

[Series 24: T1 fat-sat post-contrast · sagittal · 3.0mm · 1.00mm/px · 5 of 18 slices shown]
[im 1/18]
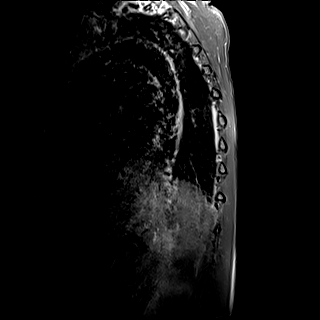
[im 5/18]
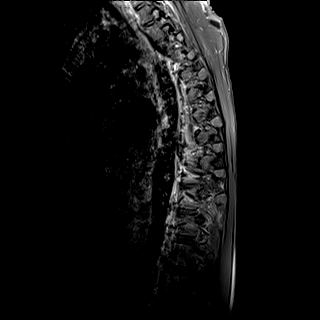
[im 9/18]
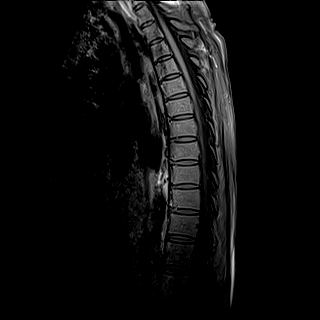
[im 13/18]
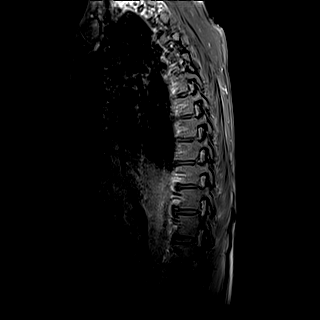
[im 18/18]
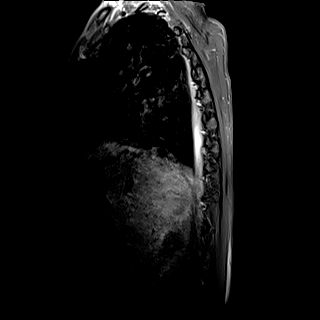

[31 of 48 positions shown; findings below may reference images not displayed]

FINDINGS: MRI CERVICAL SPINE FINDINGS

Alignment: Straightening without focal angulation or significant
listhesis.

Vertebrae: There is mild T2 hyperintensity within the C6-7 disc.
There is adjacent endplate T2 hyperintensity, T1 hypointensity and
diffuse marrow enhancement within the C6 and C7 vertebral bodies
consistent with osteomyelitis. No significant pathologic fracture.
No other significant marrow signal abnormalities in the cervical
spine.

Cord: Normal in signal and caliber. No abnormal intradural
enhancement.

Posterior Fossa, vertebral arteries, paraspinal tissues: The
visualized craniocervical junction appears normal. There are
bilateral vertebral artery flow voids. There are anterior paraspinal
inflammatory changes within the prevertebral soft tissues at C6-7
without focal fluid collection. No epidural fluid collections are
seen.

Disc levels:

Multilevel cervical spondylosis with disc bulging, uncinate spurring
and facet hypertrophy. There is no cord compression, although
multilevel mild-to-moderate foraminal narrowing is present from C3-4
through C6-7. As above, findings of discitis and early osteomyelitis
at C6-7 without associated epidural fluid collection. There are
anterior paraspinal inflammatory changes with heterogeneous
enhancement, but no focal fluid collection.

MRI THORACIC SPINE FINDINGS

Alignment:  Normal.

Vertebrae: No evidence of acute fracture, discitis or osteomyelitis
within the thoracic spine.

Cord: Normal in signal and caliber. No abnormal intradural
enhancement.

Paraspinal and other soft tissues: The thoracic paraspinal soft
tissues appear unremarkable. Trace bilateral pleural effusions.
Patchy pulmonary opacities are noted, suboptimally evaluated by MRI,
but likely reflecting atelectasis or edema based on earlier chest
radiographs.

Disc levels:

The thoracic discs appear normal. No disc herniation, spinal
stenosis or nerve root encroachment identified in the thoracic
spine.

MRI LUMBAR SPINE FINDINGS

Segmentation:  There are 5 lumbar type vertebral bodies.

Alignment: Trace retrolisthesis at L1-2 and anterolisthesis at
L5-S1.

Vertebrae: There are changes of advanced discitis and osteomyelitis
at L1-2. There is endplate destruction, loss of vertebral body
height and diffuse marrow enhancement throughout the L1 and L2
vertebral bodies. The intervening disc demonstrates heterogeneous T2
hyperintensity and enhancement. No additional levels of
discitis/osteomyelitis identified. No definite infected facet joints
are seen.

Conus medullaris and cauda equina: Conus extends to the L1-2 level.
There is mass effect on the thecal sac by anterior epidural
inflammation at L1-2, but no gross conus compression or abnormal
intradural enhancement.

Paraspinal and other soft tissues: Extensive paraspinal inflammatory
changes at L1-2 with a small peripherally enhancing fluid collection
within the right psoas muscle adjacent to the L1 vertebral body,
measuring approximately 2.2 x 0.8 x 1.5 cm. No other focal
paraspinal fluid collections are identified. There is anterior
epidural inflammation at L1-2 extending into both foramina. No
epidural fluid collection identified. The spleen is incompletely
imaged, although appears moderately enlarged. There is generalized
soft tissue edema with evidence of at least moderate ascites.

Disc levels:

T12-L1: Normal interspace.

L1-2: Advanced changes of diskitis and osteomyelitis with annular
disc bulging and extensive anterior epidural inflammation extending
into both foramina. There is moderate mass effect on the thecal sac
with moderate foraminal narrowing bilaterally, worse on the left.

L2-3: Normal interspace.

L3-4: Normal interspace.

L4-5: Disc height and hydration are maintained. Mild facet and
ligamentous hypertrophy. No significant spinal stenosis.

L5-S1: Mild disc bulging with endplate osteophytes asymmetric to the
right. Mild bilateral facet hypertrophy. Mild right foraminal
narrowing.
IMPRESSION: 1. Findings consistent with discitis and osteomyelitis at C6-7. No
associated cervical paraspinal or epidural abscess or mass effect on
the cord.
2. Advanced discitis and osteomyelitis at L1-2 with associated
pathologic fracture and extensive paraspinal and epidural
inflammatory changes. There is a small probable abscess within the
right psoas muscle and mass effect on the thecal sac and both neural
foramina. No cord compression or drainable epidural fluid
collections identified.
3. No significant thoracic spine findings.
4. Multilevel cervical spondylosis as described with foraminal
narrowing bilaterally from C3-4 through C6-7 due to spondylosis.
5. Anasarca with generalized soft tissue edema, ascites and small
bilateral pleural effusions.

## 2021-07-11 MED ORDER — METHOCARBAMOL 500 MG PO TABS
500.0000 mg | ORAL_TABLET | Freq: Three times a day (TID) | ORAL | Status: DC
  Administered 2021-07-11 – 2021-07-19 (×24): 500 mg via ORAL
  Filled 2021-07-11 (×24): qty 1

## 2021-07-11 MED ORDER — CHLORHEXIDINE GLUCONATE CLOTH 2 % EX PADS
6.0000 | MEDICATED_PAD | Freq: Every day | CUTANEOUS | Status: DC
  Administered 2021-07-11 – 2021-07-19 (×9): 6 via TOPICAL

## 2021-07-11 MED ORDER — VANCOMYCIN HCL IN DEXTROSE 1-5 GM/200ML-% IV SOLN
1000.0000 mg | Freq: Once | INTRAVENOUS | Status: AC
  Administered 2021-07-11: 13:00:00 1000 mg via INTRAVENOUS
  Filled 2021-07-11: qty 200

## 2021-07-11 MED ORDER — SODIUM CHLORIDE 0.9 % IV BOLUS
1000.0000 mL | Freq: Once | INTRAVENOUS | Status: AC
  Administered 2021-07-11: 02:00:00 1000 mL via INTRAVENOUS

## 2021-07-11 MED ORDER — SODIUM CHLORIDE 0.9% FLUSH
10.0000 mL | Freq: Two times a day (BID) | INTRAVENOUS | Status: DC
  Administered 2021-07-11 – 2021-07-18 (×14): 10 mL
  Administered 2021-07-18: 22:00:00 20 mL
  Administered 2021-07-19: 10 mL

## 2021-07-11 MED ORDER — ADULT MULTIVITAMIN W/MINERALS CH
1.0000 | ORAL_TABLET | Freq: Every day | ORAL | Status: DC
  Administered 2021-07-12 – 2021-07-19 (×7): 1 via ORAL
  Filled 2021-07-11 (×8): qty 1

## 2021-07-11 MED ORDER — GABAPENTIN 100 MG PO CAPS
200.0000 mg | ORAL_CAPSULE | Freq: Three times a day (TID) | ORAL | Status: DC
  Administered 2021-07-11 – 2021-07-19 (×24): 200 mg via ORAL
  Filled 2021-07-11 (×24): qty 2

## 2021-07-11 MED ORDER — OXYCODONE HCL 5 MG PO TABS
5.0000 mg | ORAL_TABLET | ORAL | Status: DC | PRN
  Administered 2021-07-11 – 2021-07-19 (×36): 5 mg via ORAL
  Filled 2021-07-11 (×36): qty 1

## 2021-07-11 MED ORDER — GADOBUTROL 1 MMOL/ML IV SOLN
5.0000 mL | Freq: Once | INTRAVENOUS | Status: AC | PRN
  Administered 2021-07-11: 10:00:00 5 mL via INTRAVENOUS

## 2021-07-11 MED ORDER — ONDANSETRON HCL 4 MG PO TABS
4.0000 mg | ORAL_TABLET | Freq: Four times a day (QID) | ORAL | Status: DC | PRN
  Administered 2021-07-16: 4 mg via ORAL
  Filled 2021-07-11: qty 1

## 2021-07-11 MED ORDER — SODIUM CHLORIDE 0.9% FLUSH
10.0000 mL | INTRAVENOUS | Status: DC | PRN
  Administered 2021-07-16: 10 mL

## 2021-07-11 MED ORDER — SODIUM CHLORIDE 0.9 % IV SOLN: INTRAVENOUS | Status: DC

## 2021-07-11 MED ORDER — VANCOMYCIN HCL 500 MG/100ML IV SOLN: 500.0000 mg | INTRAVENOUS | Status: DC

## 2021-07-11 MED ORDER — LORAZEPAM 2 MG/ML IJ SOLN
0.5000 mg | Freq: Once | INTRAMUSCULAR | Status: AC
Start: 2021-07-11 — End: 2021-07-11
  Administered 2021-07-11: 08:00:00 1 mg via INTRAVENOUS
  Filled 2021-07-11: qty 1

## 2021-07-11 MED ORDER — MORPHINE SULFATE (PF) 2 MG/ML IV SOLN
2.0000 mg | INTRAVENOUS | Status: DC | PRN
  Administered 2021-07-11 – 2021-07-12 (×5): 2 mg via INTRAVENOUS
  Filled 2021-07-11 (×5): qty 1

## 2021-07-11 MED ORDER — HYDROMORPHONE HCL 1 MG/ML IJ SOLN
0.5000 mg | Freq: Once | INTRAMUSCULAR | Status: AC
  Administered 2021-07-11: 03:00:00 0.5 mg via INTRAVENOUS
  Filled 2021-07-11: qty 1

## 2021-07-11 MED ORDER — MELATONIN 3 MG PO TABS
3.0000 mg | ORAL_TABLET | Freq: Every evening | ORAL | Status: DC | PRN
  Administered 2021-07-11 – 2021-07-18 (×9): 3 mg via ORAL
  Filled 2021-07-11 (×9): qty 1

## 2021-07-11 MED ORDER — SODIUM CHLORIDE 0.9 % IV SOLN
10.0000 mg/kg | Freq: Every day | INTRAVENOUS | Status: DC
  Administered 2021-07-12 – 2021-07-18 (×7): 500 mg via INTRAVENOUS
  Filled 2021-07-11 (×8): qty 10

## 2021-07-11 MED ORDER — ONDANSETRON HCL 4 MG/2ML IJ SOLN
4.0000 mg | Freq: Four times a day (QID) | INTRAMUSCULAR | Status: DC | PRN
  Administered 2021-07-13 – 2021-07-18 (×2): 4 mg via INTRAVENOUS
  Filled 2021-07-11 (×2): qty 2

## 2021-07-11 NOTE — H&P (Addendum)
History and Physical    Christina Wyatt JKD:[id number] DOB: [birth year] DOA: 07/10/2021  PCP: Georgann Housekeeper, MD  Patient coming from: [facility]  Chief Complaint: Back pain, stomach pain  HPI: Christina Wyatt is a 55 y.o. female with medical history significant of Hepatitis C, bipolar d/o; polysubstance abuse. Presenting w/ back pain and stomach pain. Hx from son at bedside and patient. She was recently discharged from [facility] w/ sepsis from MRSA bacteremia and cervical spine osteomyelitis. She was evaluated by their ID crew and placed on IV vanc for an 8 week course. She was then discharged to SNF. During her stay at the SNF, she was transitioned to daptomycin. She reports that last night she has an acute onset of severe lower back pain. It was just above her sacrum. It was sharp and non-radiating. The SNF staff became concerned because she had fever as well. So they called for EMS. She denies any other aggravating or alleviating factors.   ED Course: MRI of C,T, L spine was obtained.It was notable for discitis/osteomyelitis at C6-7 and L1-2. Case was reviewed with neurosurgery. They do not plan any surgical intervention. They recommended hospitalist admission for IV abx.   Review of Systems:  Denies CP, dyspnea, palpitations, N/V/D. Review of systems is otherwise negative for all not mentioned in HPI.   PMHx Past Medical History:  Diagnosis Date   Chronic viral hepatitis C (HCC)    Liver cirrhosis (HCC)    MRSA infection (methicillin-resistant Staphylococcus aureus)    Opioid dependence, uncomplicated (HCC)    Osteomyelitis of vertebra of cervical region [facility])    Unspecified severe protein-calorie malnutrition (HCC)     PSHx History reviewed. No pertinent surgical history.  SocHx  reports that she has quit smoking. Her smoking use included cigarettes. She smoked an average of 1 pack per day. She has never used smokeless tobacco. She reports that she does not currently use alcohol. She  reports that she does not currently use drugs.  No Known Allergies  FamHx History reviewed. No pertinent family history.  Prior to Admission medications   Medication Sig Start Date End Date Taking? Authorizing Provider  bisacodyl (DULCOLAX) 5 MG EC tablet Take 5 mg by mouth daily as needed for moderate constipation.   Yes [provider]  buprenorphine-naloxone (SUBOXONE) 2-0.5 mg SUBL SL tablet Place 2 tablets under the tongue 2 (two) times daily.   Yes [provider]  celecoxib (CELEBREX) 200 MG capsule Take 200 mg by mouth 2 (two) times daily.   Yes [provider]  DAPTOmycin (CUBICIN) 500 MG injection Inject 375 mg into the vein daily.   Yes [provider]  diclofenac Sodium (VOLTAREN) 1 % GEL Apply 4 g topically 2 (two) times daily.   Yes [provider]  furosemide (LASIX) 20 MG tablet Take 60 mg by mouth daily.   Yes [provider]  gabapentin (NEURONTIN) 400 MG capsule Take 400 mg by mouth 3 (three) times daily.   Yes [provider]  Infant Care Products [facility]) OINT Apply 1 application topically 2 (two) times daily.   Yes [provider]  Lidocaine (HM LIDOCAINE PATCH) 4 % PTCH Apply 2 patches topically daily.   Yes [provider]  melatonin 3 MG TABS tablet Take 3 mg by mouth at bedtime as needed (for sleep).   Yes [provider]  methocarbamol (ROBAXIN) 500 MG tablet Take 500 mg by mouth 4 (four) times daily.   Yes [provider]  Multiple  Vitamins-Minerals (MULTIVITAMIN WITH MINERALS) tablet Take 1 tablet by mouth daily.   Yes [provider]  polyethylene glycol powder (GLYCOLAX/MIRALAX) 17 GM/SCOOP powder Take 1 Container by mouth See admin instructions. Mix 17g (1 scoop) with 6 to 8 ounces of beverage and drink daily   Yes [provider]  senna-docusate (SENOKOT-S) 8.6-50 MG tablet Take 1 tablet by mouth 2 (two) times daily.   Yes [provider]  spironolactone (ALDACTONE) 100 MG tablet Take 200 mg by mouth daily.   Yes [provider]  naloxone (NARCAN) nasal spray 4 mg/0.1 mL Place 1 spray into the nose once. Patient not taking: Reported on 07/11/2021    [provider]    Physical Exam: Vitals:   07/11/21 0600 07/11/21 0700 07/11/21 1040 07/11/21 1142  BP: 102/69 110/78 96/65 103/70  Pulse: 71 71 79 71  Resp: 10 10 16 12   Temp:      TempSrc:      SpO2: 97% 98% 97% 99%  Weight:      Height:        General: 55 y.o. female resting in bed in NAD Eyes: PERRL, normal sclera ENMT: Nares patent w/o discharge, orophaynx clear, dentition poor, ears w/o discharge/lesions/ulcers Neck: Supple, trachea midline Cardiovascular: RRR, +S1, S2, no m/g/r, equal pulses throughout Respiratory: CTABL, no w/r/r, normal WOB GI: BS+, distended but soft, mild TTP, no masses noted, no organomegaly noted MSK: No trace b/l pedal edema, no c/c Skin: No rashes, bruises, ulcerations noted Neuro: A&O x 2, no focal deficits Psyc: Anxious, tearful, but cooperative  Labs on Admission: I have personally reviewed following labs and imaging studies  CBC: Recent Labs  Lab 07/11/21 0132  WBC 2.9*  NEUTROABS 1.9  HGB 7.7*  HCT 24.1*  MCV 104.8*  PLT 120*   Basic Metabolic Panel: Recent Labs  Lab 07/11/21 0132  NA 133*  K 4.3  CL 106  CO2 20*  GLUCOSE 107*  BUN 32*  CREATININE 1.58*  CALCIUM 8.3*   GFR: Estimated Creatinine Clearance: 29.6 mL/min (A) (by C-G formula based on SCr of 1.58 mg/dL (H)). Liver Function Tests: Recent Labs  Lab 07/11/21 0132  AST 51*  ALT 25  ALKPHOS 77  BILITOT 2.4*  PROT 8.7*  ALBUMIN 2.4*   Recent Labs  Lab 07/11/21 0132  LIPASE 46   No results for input(s): AMMONIA in the last 168 hours. Coagulation Profile: Recent Labs  Lab 07/11/21 0132  INR 1.4*   Cardiac Enzymes: No results for input(s): CKTOTAL, CKMB, CKMBINDEX, TROPONINI in the last 168 hours. BNP  (last 3 results) No results for input(s): PROBNP in the last 8760 hours. HbA1C: No results for input(s): HGBA1C in the last 72 hours. CBG: No results for input(s): GLUCAP in the last 168 hours. Lipid Profile: No results for input(s): CHOL, HDL, LDLCALC, TRIG, CHOLHDL, LDLDIRECT in the last 72 hours. Thyroid Function Tests: No results for input(s): TSH, T4TOTAL, FREET4, T3FREE, THYROIDAB in the last 72 hours. Anemia Panel: No results for input(s): VITAMINB12, FOLATE, FERRITIN, TIBC, IRON, RETICCTPCT in the last 72 hours. Urine analysis:    Component Value Date/Time   COLORURINE YELLOW 07/11/2021 0108   APPEARANCEUR CLEAR 07/11/2021 0108   LABSPEC 1.018 07/11/2021 0108   PHURINE 7.0 07/11/2021 0108   GLUCOSEU NEGATIVE 07/11/2021 0108   HGBUR NEGATIVE 07/11/2021 0108   BILIRUBINUR NEGATIVE 07/11/2021 0108   KETONESUR NEGATIVE 07/11/2021 0108   PROTEINUR NEGATIVE 07/11/2021 0108   NITRITE NEGATIVE 07/11/2021 0108   LEUKOCYTESUR  NEGATIVE 07/11/2021 0108    Radiological Exams on Admission: MR Cervical Spine W or Wo Contrast  Result Date: 07/11/2021 CLINICAL DATA:  Mid to low back pain. Reported findings of discitis/osteomyelitis on outside MRI 06/26/2021. history of drug abuse. EXAM: MRI CERVICAL, THORACIC AND LUMBAR SPINE WITHOUT AND WITH CONTRAST TECHNIQUE: Multiplanar and multiecho pulse sequences of the cervical spine, to include the craniocervical junction and cervicothoracic junction, and thoracic and lumbar spine, were obtained without and with intravenous contrast. CONTRAST:  5mL GADAVIST GADOBUTROL 1 MMOL/ML IV SOLN COMPARISON:  Portable chest radiograph same date. No previous outside imaging or reports available. FINDINGS: MRI CERVICAL SPINE FINDINGS Alignment: Straightening without focal angulation or significant listhesis. Vertebrae: There is mild T2 hyperintensity within the C6-7 disc. There is adjacent endplate T2 hyperintensity, T1 hypointensity and diffuse marrow enhancement  within the C6 and C7 vertebral bodies consistent with osteomyelitis. No significant pathologic fracture. No other significant marrow signal abnormalities in the cervical spine. Cord: Normal in signal and caliber. No abnormal intradural enhancement. Posterior Fossa, vertebral arteries, paraspinal tissues: The visualized craniocervical junction appears normal. There are bilateral vertebral artery flow voids. There are anterior paraspinal inflammatory changes within the prevertebral soft tissues at C6-7 without focal fluid collection. No epidural fluid collections are seen. Disc levels: Multilevel cervical spondylosis with disc bulging, uncinate spurring and facet hypertrophy. There is no cord compression, although multilevel mild-to-moderate foraminal narrowing is present from C3-4 through C6-7. As above, findings of discitis and early osteomyelitis at C6-7 without associated epidural fluid collection. There are anterior paraspinal inflammatory changes with heterogeneous enhancement, but no focal fluid collection. MRI THORACIC SPINE FINDINGS Alignment:  Normal. Vertebrae: No evidence of acute fracture, discitis or osteomyelitis within the thoracic spine. Cord: Normal in signal and caliber. No abnormal intradural enhancement. Paraspinal and other soft tissues: The thoracic paraspinal soft tissues appear unremarkable. Trace bilateral pleural effusions. Patchy pulmonary opacities are noted, suboptimally evaluated by MRI, but likely reflecting atelectasis or edema based on earlier chest radiographs. Disc levels: The thoracic discs appear normal. No disc herniation, spinal stenosis or nerve root encroachment identified in the thoracic spine. MRI LUMBAR SPINE FINDINGS Segmentation:  There are 5 lumbar type vertebral bodies. Alignment: Trace retrolisthesis at L1-2 and anterolisthesis at L5-S1. Vertebrae: There are changes of advanced discitis and osteomyelitis at L1-2. There is endplate destruction, loss of vertebral body  height and diffuse marrow enhancement throughout the L1 and L2 vertebral bodies. The intervening disc demonstrates heterogeneous T2 hyperintensity and enhancement. No additional levels of discitis/osteomyelitis identified. No definite infected facet joints are seen. Conus medullaris and cauda equina: Conus extends to the L1-2 level. There is mass effect on the thecal sac by anterior epidural inflammation at L1-2, but no gross conus compression or abnormal intradural enhancement. Paraspinal and other soft tissues: Extensive paraspinal inflammatory changes at L1-2 with a small peripherally enhancing fluid collection within the right psoas muscle adjacent to the L1 vertebral body, measuring approximately 2.2 x 0.8 x 1.5 cm. No other focal paraspinal fluid collections are identified. There is anterior epidural inflammation at L1-2 extending into both foramina. No epidural fluid collection identified. The spleen is incompletely imaged, although appears moderately enlarged. There is generalized soft tissue edema with evidence of at least moderate ascites. Disc levels: T12-L1: Normal interspace. L1-2: Advanced changes of diskitis and osteomyelitis with annular disc bulging and extensive anterior epidural inflammation extending into both foramina. There is moderate mass effect on the thecal sac with moderate foraminal narrowing bilaterally, worse on the left. L2-3: Normal  interspace. L3-4: Normal interspace. L4-5: Disc height and hydration are maintained. Mild facet and ligamentous hypertrophy. No significant spinal stenosis. L5-S1: Mild disc bulging with endplate osteophytes asymmetric to the right. Mild bilateral facet hypertrophy. Mild right foraminal narrowing. IMPRESSION: 1. Findings consistent with discitis and osteomyelitis at C6-7. No associated cervical paraspinal or epidural abscess or mass effect on the cord. 2. Advanced discitis and osteomyelitis at L1-2 with associated pathologic fracture and extensive  paraspinal and epidural inflammatory changes. There is a small probable abscess within the right psoas muscle and mass effect on the thecal sac and both neural foramina. No cord compression or drainable epidural fluid collections identified. 3. No significant thoracic spine findings. 4. Multilevel cervical spondylosis as described with foraminal narrowing bilaterally from C3-4 through C6-7 due to spondylosis. 5. Anasarca with generalized soft tissue edema, ascites and small bilateral pleural effusions. Electronically Signed   By: Carey Bullocks M.D.   On: 07/11/2021 10:43   MR THORACIC SPINE W WO CONTRAST  Result Date: 07/11/2021 CLINICAL DATA:  Mid to low back pain. Reported findings of discitis/osteomyelitis on outside MRI 06/26/2021. history of drug abuse. EXAM: MRI CERVICAL, THORACIC AND LUMBAR SPINE WITHOUT AND WITH CONTRAST TECHNIQUE: Multiplanar and multiecho pulse sequences of the cervical spine, to include the craniocervical junction and cervicothoracic junction, and thoracic and lumbar spine, were obtained without and with intravenous contrast. CONTRAST:  5mL GADAVIST GADOBUTROL 1 MMOL/ML IV SOLN COMPARISON:  Portable chest radiograph same date. No previous outside imaging or reports available. FINDINGS: MRI CERVICAL SPINE FINDINGS Alignment: Straightening without focal angulation or significant listhesis. Vertebrae: There is mild T2 hyperintensity within the C6-7 disc. There is adjacent endplate T2 hyperintensity, T1 hypointensity and diffuse marrow enhancement within the C6 and C7 vertebral bodies consistent with osteomyelitis. No significant pathologic fracture. No other significant marrow signal abnormalities in the cervical spine. Cord: Normal in signal and caliber. No abnormal intradural enhancement. Posterior Fossa, vertebral arteries, paraspinal tissues: The visualized craniocervical junction appears normal. There are bilateral vertebral artery flow voids. There are anterior paraspinal  inflammatory changes within the prevertebral soft tissues at C6-7 without focal fluid collection. No epidural fluid collections are seen. Disc levels: Multilevel cervical spondylosis with disc bulging, uncinate spurring and facet hypertrophy. There is no cord compression, although multilevel mild-to-moderate foraminal narrowing is present from C3-4 through C6-7. As above, findings of discitis and early osteomyelitis at C6-7 without associated epidural fluid collection. There are anterior paraspinal inflammatory changes with heterogeneous enhancement, but no focal fluid collection. MRI THORACIC SPINE FINDINGS Alignment:  Normal. Vertebrae: No evidence of acute fracture, discitis or osteomyelitis within the thoracic spine. Cord: Normal in signal and caliber. No abnormal intradural enhancement. Paraspinal and other soft tissues: The thoracic paraspinal soft tissues appear unremarkable. Trace bilateral pleural effusions. Patchy pulmonary opacities are noted, suboptimally evaluated by MRI, but likely reflecting atelectasis or edema based on earlier chest radiographs. Disc levels: The thoracic discs appear normal. No disc herniation, spinal stenosis or nerve root encroachment identified in the thoracic spine. MRI LUMBAR SPINE FINDINGS Segmentation:  There are 5 lumbar type vertebral bodies. Alignment: Trace retrolisthesis at L1-2 and anterolisthesis at L5-S1. Vertebrae: There are changes of advanced discitis and osteomyelitis at L1-2. There is endplate destruction, loss of vertebral body height and diffuse marrow enhancement throughout the L1 and L2 vertebral bodies. The intervening disc demonstrates heterogeneous T2 hyperintensity and enhancement. No additional levels of discitis/osteomyelitis identified. No definite infected facet joints are seen. Conus medullaris and cauda equina: Conus extends to the L1-2  level. There is mass effect on the thecal sac by anterior epidural inflammation at L1-2, but no gross conus  compression or abnormal intradural enhancement. Paraspinal and other soft tissues: Extensive paraspinal inflammatory changes at L1-2 with a small peripherally enhancing fluid collection within the right psoas muscle adjacent to the L1 vertebral body, measuring approximately 2.2 x 0.8 x 1.5 cm. No other focal paraspinal fluid collections are identified. There is anterior epidural inflammation at L1-2 extending into both foramina. No epidural fluid collection identified. The spleen is incompletely imaged, although appears moderately enlarged. There is generalized soft tissue edema with evidence of at least moderate ascites. Disc levels: T12-L1: Normal interspace. L1-2: Advanced changes of diskitis and osteomyelitis with annular disc bulging and extensive anterior epidural inflammation extending into both foramina. There is moderate mass effect on the thecal sac with moderate foraminal narrowing bilaterally, worse on the left. L2-3: Normal interspace. L3-4: Normal interspace. L4-5: Disc height and hydration are maintained. Mild facet and ligamentous hypertrophy. No significant spinal stenosis. L5-S1: Mild disc bulging with endplate osteophytes asymmetric to the right. Mild bilateral facet hypertrophy. Mild right foraminal narrowing. IMPRESSION: 1. Findings consistent with discitis and osteomyelitis at C6-7. No associated cervical paraspinal or epidural abscess or mass effect on the cord. 2. 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 There is a small probable abscess within the right psoas muscle and mass effect on the thecal sac and both neural foramina. No cord compression or drainable epidural fluid collections identified. 3. No significant thoracic spine findings. 4. Multilevel cervical spondylosis as described with foraminal narrowing bilaterally from C3-4 through C6-7 due to spondylosis. 5. Anasarca with generalized  soft tissue edema, ascites and small bilateral pleural effusions. Electronically Signed   By: Carey Bullocks M.D.   On: 07/11/2021 10:43   MR Lumbar Spine W Wo Contrast  Result Date: 07/11/2021 CLINICAL DATA:  Mid to low back pain. Reported findings of discitis/osteomyelitis on outside MRI 06/26/2021. history of drug abuse. EXAM: MRI CERVICAL, THORACIC AND LUMBAR SPINE WITHOUT AND WITH CONTRAST TECHNIQUE: Multiplanar and multiecho pulse sequences of the cervical spine, to include the craniocervical junction and cervicothoracic junction, and thoracic and lumbar spine, were obtained without and with intravenous contrast. CONTRAST:  34mL GADAVIST GADOBUTROL 1 MMOL/ML IV SOLN COMPARISON:  Portable chest radiograph same date. No previous outside imaging or reports available. FINDINGS: MRI CERVICAL SPINE FINDINGS Alignment: Straightening without focal angulation or significant listhesis. Vertebrae: There is mild T2 hyperintensity within the C6-7 disc. There is adjacent endplate T2 hyperintensity, T1 hypointensity and diffuse marrow enhancement within the C6 and C7 vertebral bodies consistent with osteomyelitis. No significant pathologic fracture. No other significant marrow signal abnormalities in the cervical spine. Cord: Normal in signal and caliber. No abnormal intradural enhancement. Posterior Fossa, vertebral arteries, paraspinal tissues: The visualized craniocervical junction appears normal. There are bilateral vertebral artery flow voids. There are anterior paraspinal inflammatory changes within the prevertebral soft tissues at C6-7 without focal fluid collection. No epidural fluid collections are seen. Disc levels: Multilevel cervical spondylosis with disc bulging, uncinate spurring and facet hypertrophy. There is no cord compression, although multilevel mild-to-moderate foraminal narrowing is present from C3-4 through C6-7. As above, findings of discitis and early osteomyelitis at C6-7 without associated  epidural fluid collection. There are anterior paraspinal inflammatory changes with heterogeneous enhancement, but no focal fluid collection. MRI THORACIC SPINE FINDINGS Alignment:  Normal. Vertebrae: No evidence of acute fracture, discitis or osteomyelitis within the thoracic  spine. Cord: Normal in signal and caliber. No abnormal intradural enhancement. Paraspinal and other soft tissues: The thoracic paraspinal soft tissues appear unremarkable. Trace bilateral pleural effusions. Patchy pulmonary opacities are noted, suboptimally evaluated by MRI, but likely reflecting atelectasis or edema based on earlier chest radiographs. Disc levels: The thoracic discs appear normal. No disc herniation, spinal stenosis or nerve root encroachment identified in the thoracic spine. MRI LUMBAR SPINE FINDINGS Segmentation:  There are 5 lumbar type vertebral bodies. Alignment: Trace retrolisthesis at L1-2 and anterolisthesis at L5-S1. Vertebrae: There are changes of advanced discitis and osteomyelitis at L1-2. There is endplate destruction, loss of vertebral body height and diffuse marrow enhancement throughout the L1 and L2 vertebral bodies. The intervening disc demonstrates heterogeneous T2 hyperintensity and enhancement. No additional levels of discitis/osteomyelitis identified. No definite infected facet joints are seen. Conus medullaris and cauda equina: Conus extends to the L1-2 level. There is mass effect on the thecal sac by anterior epidural inflammation at L1-2, but no gross conus compression or abnormal intradural enhancement. Paraspinal and other soft tissues: Extensive paraspinal inflammatory changes at L1-2 with a small peripherally enhancing fluid collection within the right psoas muscle adjacent to the L1 vertebral body, measuring approximately 2.2 x 0.8 x 1.5 cm. No other focal paraspinal fluid collections are identified. There is anterior epidural inflammation at L1-2 extending into both foramina. No epidural fluid  collection identified. The spleen is incompletely imaged, although appears moderately enlarged. There is generalized soft tissue edema with evidence of at least moderate ascites. Disc levels: T12-L1: Normal interspace. L1-2: Advanced changes of diskitis and osteomyelitis with annular disc bulging and extensive anterior epidural inflammation extending into both foramina. There is moderate mass effect on the thecal sac with moderate foraminal narrowing bilaterally, worse on the left. L2-3: Normal interspace. L3-4: Normal interspace. L4-5: Disc height and hydration are maintained. Mild facet and ligamentous hypertrophy. No significant spinal stenosis. L5-S1: Mild disc bulging with endplate osteophytes asymmetric to the right. Mild bilateral facet hypertrophy. Mild right foraminal narrowing. IMPRESSION: 1. Findings consistent with discitis and osteomyelitis at C6-7. No associated cervical paraspinal or epidural abscess or mass effect on the cord. 2. 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 There is a small probable abscess within the right psoas muscle and mass effect on the thecal sac and both neural foramina. No cord compression or drainable epidural fluid collections identified. 3. No significant thoracic spine findings. 4. Multilevel cervical spondylosis as described with foraminal narrowing bilaterally from C3-4 through C6-7 due to spondylosis. 5. Anasarca with generalized soft tissue edema, ascites and small bilateral pleural effusions. Electronically Signed   By: Carey Bullocks M.D.   On: 07/11/2021 10:43   DG Chest Port 1 View  Result Date: 07/11/2021 CLINICAL DATA:  Chronic lower back pain. EXAM: PORTABLE CHEST 1 VIEW COMPARISON:  None. FINDINGS: A right-sided venous catheter is seen with its distal tip noted at the junction of the superior vena cava and right atrium. Mild linear atelectasis is present  within the bilateral lung bases. There is no evidence of a pleural effusion or pneumothorax. The heart size and mediastinal contours are within normal limits. The visualized skeletal structures are unremarkable. IMPRESSION: 1. Right-sided venous catheter positioning, as described above. 2. Mild bibasilar linear atelectasis. Electronically Signed   By: Aram Candela M.D.   On: 07/11/2021 00:36    EKG: Independently reviewed. Sinus tachy, no st elevations  Assessment/Plan Discitis and osteomyelitis of the C  spine and L spine     - admit to inpt, tele     - ED reviewed case with neurosurgery; no surgical intervention planned     - spoke with ID, will transition back to vanc; pharm to dose     - follow Bld Cx     - pain control, fluids  Hepatitis C Cirrhosis of liver     - BP is soft d/t sepsis     - hold diuretic for today     - give fluids     - doesn't look like she's on lactulose     - check ammonia level; start lactulose if elevated  AKI     - looks like baseline is 0.7 - 0.8; she's 1.58 today     - check renal US     - fluids  Macrocytic anemia     - looks like baseline is about 8 - 10; she's 7.7 today     - check B12, THF     - no evidence of bleed, follow  Polysubstance abuse     - normally on suboxone     - watch narcotic use  DVT prophylaxis: SCDs  Code Status: DNR (has DNR paperwork)  Family Communication: w/ son at bedside  Consults called: ED spoke with neurosurgery; I spoke with ID (Dr. Daiva Eves)   Status is: Inpatient  Remains inpatient appropriate because: severity of illness  Cary Wilford A Ronaldo Miyamoto DO [facility]  If 7PM-7AM, please contact night-coverage [email or web address]  07/11/2021, 12:21 PM

## 2021-07-11 NOTE — Progress Notes (Addendum)
Pharmacy Antibiotic Note  Christina Wyatt is a 55 y.o. female admitted on 07/10/2021 with  osteomyelitis .  Pharmacy has been consulted for vancomycin dosing.  Patient is a 36 yoF admitted from [facility] with back pain and stomach pain. PMH significant for polysubstance abuse, hepC. Pt was recently admitted to [facility] with MRSA bacteremia and cervical spine OM with plans for 8 week course of antibiotics, on daptomycin currently PTA. Last dose 11/25 per med rec. Antibiotics being transitioned to vancomycin per pharmacy based on ID recommendations.   Today, 07/11/21 WBC low SCr 1.58, CrCl ~30 mL/min TBW 52 kg, BMI 21 Afebrile  Plan: Vancomycin 1000 mg LD in ED followed by 500 mg IV q24h for estimated AUC of 460 Goal vancomycin AUC 400-550 Follow renal function. Check vancomycin levels once at steady state.  Height: 5\' 1"  (154.9 cm) Weight: 52.2 kg (115 lb) IBW/kg (Calculated) : 47.8  Temp (24hrs), Avg:98.1 F (36.7 C), Min:98.1 F (36.7 C), Max:98.1 F (36.7 C)  Recent Labs  Lab 07/11/21 0132  WBC 2.9*  CREATININE 1.58*  LATICACIDVEN 1.7    Estimated Creatinine Clearance: 29.6 mL/min (A) (by C-G formula based on SCr of 1.58 mg/dL (H)).    No Known Allergies  Antimicrobials this admission: vancomycin 11/27 >>   Dose adjustments this admission:  Microbiology results: 11/27 BCx:   Thank you for allowing pharmacy to be a part of this patient's care.  12/27, PharmD 07/11/2021 1:46 PM  Addendum - Evening Update:  ID changing antibiotics from vancomycin to daptomycin. Pt already received a dose of vancomycin today.   Plan: -Start daptomycin 500 mg (10 mg/kg) IV q24h tomorrow. -Check SCr, CK with AM labs tomorrow  07/13/2021, PharmD 07/11/21 6:04 PM

## 2021-07-11 NOTE — Consult Note (Signed)
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 diagnosed at [facility] when the patient was admitted with this and MRSA bacteremia (2D echocardiogram and transesophageal echocardiogram were unrevealing for endocarditis there) with sepsis and endorgan failure IVDU and alcohol abuse 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 chronic hepatitis C and alcohol abuse Ascites status post 3 paracenteses at [facility] Hyperbilirubinemia and jaundice Acute renal insufficiency History of puslike area in the antecubital fossa surrounding basilic vein seen on November 9 and partially thrombosed basilic vein seen in the [facility] fossa in the IV on the left on [facility] Pancytopenia likely from her advanced cirrhosis  Plan:  Switch back from vancomycin to daptomycin Close neurological monitoring Would recommend ultrasound-guided paracentesis for therapeutic reasons Strongly consider palliative care consult as patient was seen by palliative care at [facility] and made DNR Can consider treating her hepatitis C as an outpatient. She should also be seen by GI here for optimizing management of her cirrhosis  Principal Problem:   Diskitis Active Problems:   Sepsis ([place])   IVDU (intravenous drug user)   MRSA bacteremia   Scheduled Meds:  Chlorhexidine Gluconate Cloth  6 each Topical Daily   gabapentin  200 mg Oral TID   methocarbamol  500 mg Oral TID   multivitamin with minerals  1 tablet Oral Daily   sodium  chloride flush  10-40 mL Intracatheter Q12H   Continuous Infusions:  sodium chloride 100 mL/hr at 07/11/21 1600   PRN Meds:.melatonin, morphine injection, ondansetron **OR** ondansetron (ZOFRAN) IV, oxyCODONE, sodium chloride flush  HPI: Christina Wyatt is a 55 y.o. female woman with history of bipolar disorder, IV drug use with chronic hepatitis C who was admitted to [facility] hospital November 5 with worsening abdominal pain and swelling along with lower extremity edema reduced appetite and chronic back pain that worsened as well.  Having subjective fevers and chills on admission as well as dyspnea and tachypnea.  She was admitted with concern for sepsis to [facility].  Blood cultures taken on admission subsequently grew methicillin-resistant Staph aureus.  Back and neck pain the patient underwent MRI of the cervical spine on November 12.  This showed evidence of discitis and osteomyelitis at C6 and 7 5 and 6.  There was also apparent epidural thickening from C5-C6 disc space down to near C7 and T1 concerning for an epidural abscess.  She did undergo transthoracic echocardiogram and transesophageal echocardiogram both of which failed to show evidence of endocarditis.  During that admission also had a thrombosed basilic vein on the left as well as phlebitis on the right where she had had some purulence.  She is found to have severe cirrhosis of the liver presumably from her hepatitis C plus alcohol abuse.  During her admission she had significant ascites and underwent diagnostic and therapeutic paracentesis x3.  She had been on vancomycin and cefepime cultures from the peritoneal space were unrevealing.    Her blood cultures cleared and she was narrowed  to vancomycin with arrangement for discharge to skilled nursing facility which was I believe [facility]. She had tunneled catheter placed by IR at [facility]. Note the DC note mentioned her being discharged on daptomycin  though infectious disease phone note mention that she was on IV vancomycin  Currently while at [facility] she had worsening renal function and called ID clinic.  Dr. Drema Dallas recommended changing her to daptomycin at 8 mg/kg/day November 24.  He was then informed that the patient was in fact on daptomycin and that this change was made on DC.  Currently in the last 24 hours she developed severe lower back pain.  Skilled nurse facility was staff were concerned because she had also become febrile.  EMS was called and [facility] was [facility].  In the ER at [facility] imaging was consistent with discitis and osteomyelitis of the C6-C7 level but without a paraspinal or epidural abscess seen along with spondylosis and foraminal narrowing from C3-C4 through C6-C7 with newly diagnosed discitis and osteomyelitis at L1 and L2 with a pathological fracture and extensive paraspinal and epidural inflammatory changes and a small abscess in the right psoas muscle.  Her thoracic spine was also imaged but there was no evidence of infection found.  I was initially called about the patient I was under the impression that the had been on protracted daptomycin therapy and I was concerned that they might be failing daptomycin therapy which I have seen occur in the past in the context of significant vertebral discitis and osteomyelitis.  I then came under the impression that the patient was switched to daptomycin from vancomycin at the skilled nursing facility due to nephrotoxicity but now that have been able to read through care everywhere's notes including her discharge summary progress Notes and multiple phone notes by infectious disease as I mentioned it turns out that she was discharged on daptomycin November 19.  Regardless I think that given that she has not been on the daptomycin for long that this is not likely a daptomycin failure.  I suspect she already had infection in the lumbar spine and this was  just not known about in particular if the area was not imaged.  I am changing her back from vancomycin to daptomycin now.  Neurosurgery been called and do not feel there is a neurosurgical intervention that need to be pursued.  We will continue daptomycin for now.  She has marked distention of her abdomen and I suspect needs another diagnostic and therapeutic paracentesis.   I spent 115 minutes with the patient including than 50% of the time in face to face counseling of the patient guarding her known MRSA bacteremia in the context of IV drug use along with cervical spine and now lumbar spine infection that have been diagnosed her hepatitis C her cirrhosis of the liver liver ascites along with personally reviewing of the cervical thoracic and lumbar spine CBC CMP along with review of medical records from [facility] and [facility] healthcare system via care everywhere in preparation for the visit and during the visit and in coordination of her care.   Dr Baxter Flattery is back tomorrow.     Review of Systems: Review of Systems  Constitutional:  Positive for fever. Negative for chills, malaise/fatigue and weight loss.  HENT:  Negative for congestion and sore throat.   Eyes:  Negative for blurred vision and photophobia.  Respiratory:  Negative for cough, shortness of breath and wheezing.   Cardiovascular:  Negative  for chest pain, palpitations and leg swelling.  Gastrointestinal:  Positive for abdominal pain and nausea. Negative for blood in stool, constipation, diarrhea, heartburn, melena and vomiting.  Genitourinary:  Negative for dysuria, flank pain and hematuria.  Musculoskeletal:  Positive for back pain and myalgias. Negative for falls and joint pain.  Skin:  Negative for itching and rash.  Neurological:  Negative for dizziness, focal weakness, loss of consciousness, weakness and headaches.  Endo/Heme/Allergies:  Does not bruise/bleed easily.  Psychiatric/Behavioral:  Positive for  depression. Negative for suicidal ideas. The patient is nervous/anxious and has insomnia.    Past Medical History:  Diagnosis Date   Chronic viral hepatitis C ([place])    IVDU (intravenous drug user) 07/11/2021   Liver cirrhosis ([place])    MRSA bacteremia 07/11/2021   MRSA infection (methicillin-resistant Staphylococcus aureus)    Opioid dependence, uncomplicated (HCC)    Osteomyelitis of vertebra of cervical region [facility])    Unspecified severe protein-calorie malnutrition (HCC)     Social History   Tobacco Use   Smoking status: Former    Packs/day: 1.00    Types: Cigarettes   Smokeless tobacco: Never  Vaping Use   Vaping Use: Never used  Substance Use Topics   Alcohol use: Not Currently   Drug use: Not Currently    History reviewed. No pertinent family history. No Known Allergies  OBJECTIVE: Blood pressure (!) 144/84, pulse 91, temperature (!) 97.5 F (36.4 C), temperature source Oral, resp. rate 18, height 5\' 1"  (1.549 m), weight 52.2 kg, SpO2 100 %.  Physical Exam Constitutional:      General: She is not in acute distress.    Appearance: Normal appearance. She is well-developed. She is not ill-appearing or diaphoretic.  HENT:     Head: Normocephalic and atraumatic.     Right Ear: Hearing and external ear normal.     Left Ear: Hearing and external ear normal.     Nose: No nasal deformity or rhinorrhea.  Eyes:     General: No scleral icterus.    Conjunctiva/sclera: Conjunctivae normal.     Right eye: Right conjunctiva is not injected.     Left eye: Left conjunctiva is not injected.     Pupils: Pupils are equal, round, and reactive to light.  Neck:     Vascular: No JVD.  Cardiovascular:     Rate and Rhythm: Regular rhythm. Tachycardia present.     Heart sounds: Normal heart sounds, S1 normal and S2 normal.  Pulmonary:     Effort: Pulmonary effort is normal. No respiratory distress.     Breath sounds: No wheezing.  Abdominal:     General: Bowel sounds are normal.  There is distension.     Palpations: Abdomen is soft. There is shifting dullness and fluid wave.     Tenderness: There is abdominal tenderness.  Musculoskeletal:        General: Normal range of motion.     Right shoulder: Normal.     Left shoulder: Normal.     Cervical back: Normal range of motion and neck supple.     Right hip: Normal.     Left hip: Normal.     Right knee: Normal.     Left knee: Normal.  Lymphadenopathy:     Head:     Right side of head: No submandibular, preauricular or posterior auricular adenopathy.     Left side of head: No submandibular, preauricular or posterior auricular adenopathy.     Cervical: No cervical adenopathy.  Right cervical: No superficial or deep cervical adenopathy.    Left cervical: No superficial or deep cervical adenopathy.  Skin:    General: Skin is warm and dry.     Coloration: Skin is jaundiced. Skin is not pale.     Findings: No abrasion, bruising, ecchymosis, erythema, lesion or rash.     Nails: There is no clubbing.  Neurological:     General: No focal deficit present.     Mental Status: She is alert and oriented to person, place, and time.  Psychiatric:        Attention and Perception: She is attentive.        Speech: Speech normal.        Behavior: Behavior normal. Behavior is cooperative.        Thought Content: Thought content normal.        Judgment: Judgment normal.    Lab Results Lab Results  Component Value Date   WBC 2.9 (L) 07/11/2021   HGB 7.7 (L) 07/11/2021   HCT 24.1 (L) 07/11/2021   MCV 104.8 (H) 07/11/2021   PLT 120 (L) 07/11/2021    Lab Results  Component Value Date   CREATININE 1.58 (H) 07/11/2021   BUN 32 (H) 07/11/2021   NA 133 (L) 07/11/2021   K 4.3 07/11/2021   CL 106 07/11/2021   CO2 20 (L) 07/11/2021    Lab Results  Component Value Date   ALT 25 07/11/2021   AST 51 (H) 07/11/2021   ALKPHOS 77 07/11/2021   BILITOT 2.4 (H) 07/11/2021     Microbiology: Recent Results (from the past 240  hour(s))  Resp Panel by RT-PCR (Flu A&B, Covid) Nasopharyngeal Swab     Status: None   Collection Time: 07/11/21 12:47 PM   Specimen: Nasopharyngeal Swab; Nasopharyngeal(NP) swabs in vial transport medium  Result Value Ref Range Status   SARS Coronavirus 2 by RT PCR NEGATIVE NEGATIVE Final    Comment: (NOTE) SARS-CoV-2 target nucleic acids are NOT DETECTED.  The SARS-CoV-2 RNA is generally detectable in upper respiratory specimens during the acute phase of infection. The lowest concentration of SARS-CoV-2 viral copies this assay can detect is 138 copies/mL. A negative result does not preclude SARS-Cov-2 infection and should not be used as the sole basis for treatment or other patient management decisions. A negative result may occur with  improper specimen collection/handling, submission of specimen other than nasopharyngeal swab, presence of viral mutation(s) within the areas targeted by this assay, and inadequate number of viral copies(<138 copies/mL). A negative result must be combined with clinical observations, patient history, and epidemiological information. The expected result is Negative.  Fact Sheet for Patients:  EntrepreneurPulse.com.au  Fact Sheet for Healthcare Providers:  IncredibleEmployment.be  This test is no t yet approved or cleared by the [place] FDA and  has been authorized for detection and/or diagnosis of SARS-CoV-2 by FDA under an Emergency Use Authorization (EUA). This EUA will remain  in effect (meaning this test can be used) for the duration of the COVID-19 declaration under Section 564(b)(1) of the Act, 21 U.S.C.section 360bbb-3(b)(1), unless the authorization is terminated  or revoked sooner.       Influenza A by PCR NEGATIVE NEGATIVE Final   Influenza B by PCR NEGATIVE NEGATIVE Final    Comment: (NOTE) The Xpert Xpress SARS-CoV-2/FLU/RSV plus assay is intended as an aid in the diagnosis of influenza from  Nasopharyngeal swab specimens and should not be used as a sole basis for treatment. Nasal washings and  aspirates are unacceptable for Xpert Xpress SARS-CoV-2/FLU/RSV testing.  Fact Sheet for Patients: BloggerCourse.com  Fact Sheet for Healthcare Providers: SeriousBroker.it  This test is not yet approved or cleared by the [place] FDA and has been authorized for detection and/or diagnosis of SARS-CoV-2 by FDA under an Emergency Use Authorization (EUA). This EUA will remain in effect (meaning this test can be used) for the duration of the COVID-19 declaration under Section 564(b)(1) of the Act, 21 U.S.C. section 360bbb-3(b)(1), unless the authorization is terminated or revoked.  Performed at [facility], [address], [place], [place] [address]     Acey Lav, MD [facility] [phone] pager  07/11/2021, 6:04 PM

## 2021-07-11 NOTE — ED Notes (Signed)
Pt states she is hungry. Food tray ordered

## 2021-07-11 NOTE — ED Provider Notes (Signed)
[name] Provider Note   CSN: [id number] Arrival date & time: 07/10/21  2331     History Chief Complaint  Patient presents with   Back Pain    Lower Back x4 hrs    Christina Wyatt is a 55 y.o. female.  55 y/o female with hx of hepatitis C, cirrhosis, bipolar disorder, IV heroin use, crack cocaine use, and recent hospitalization for sepsis 2/2 MRSA bacteremia and osteomyelitis (06/19/21-07/02/21, currently on IV Daptomycin infusions) presents to the ED from [facility] for evaluation of back pain. Patient reports pain in her left low back which is chronic and has been ongoing since discharge to her facility. Pain subjectively worse x 4 hours. She reports being continued on Suboxone, Robaxin, Gabapentin. EMS reports temp of 101F prior to arrival, but no antipyretics given. Denies any new trauma, injury, or incontinence. Code status: DNR.   Back Pain     Past Medical History:  Diagnosis Date   Chronic viral hepatitis C (HCC)    Liver cirrhosis (HCC)    MRSA infection (methicillin-resistant Staphylococcus aureus)    Opioid dependence, uncomplicated (HCC)    Osteomyelitis of vertebra of cervical region [facility])    Unspecified severe protein-calorie malnutrition (HCC)     There are no problems to display for this patient.   History reviewed. No pertinent surgical history.   OB History   No obstetric history on file.     History reviewed. No pertinent family history.  Social History   Tobacco Use   Smoking status: Former    Packs/day: 1.00    Types: Cigarettes   Smokeless tobacco: Never  Vaping Use   Vaping Use: Never used  Substance Use Topics   Alcohol use: Not Currently   Drug use: Not Currently    Home Medications Prior to Admission medications   Medication Sig Start Date End Date Taking? Authorizing Provider  bisacodyl (DULCOLAX) 5 MG EC tablet Take 5 mg by mouth daily as needed for moderate constipation.   Yes  [provider]  buprenorphine-naloxone (SUBOXONE) 2-0.5 mg SUBL SL tablet Place 2 tablets under the tongue 2 (two) times daily.   Yes [provider]  celecoxib (CELEBREX) 200 MG capsule Take 200 mg by mouth 2 (two) times daily.   Yes [provider]  DAPTOmycin (CUBICIN) 500 MG injection Inject 375 mg into the vein daily.   Yes [provider]  diclofenac Sodium (VOLTAREN) 1 % GEL Apply 4 g topically 2 (two) times daily.   Yes [provider]  furosemide (LASIX) 20 MG tablet Take 60 mg by mouth daily.   Yes [provider]  gabapentin (NEURONTIN) 400 MG capsule Take 400 mg by mouth 3 (three) times daily.   Yes [provider]  [employer] [facility]) OINT Apply 1 application topically 2 (two) times daily.   Yes [provider]  Lidocaine (HM LIDOCAINE PATCH) 4 % PTCH Apply 2 patches topically daily.   Yes [provider]  melatonin 3 MG TABS tablet Take 3 mg by mouth at bedtime as needed (for sleep).   Yes [provider]  methocarbamol (ROBAXIN) 500 MG tablet Take 500 mg by mouth 4 (four) times daily.   Yes [provider]  Multiple Vitamins-Minerals (MULTIVITAMIN WITH MINERALS) tablet Take 1 tablet by mouth daily.   Yes [provider]  polyethylene glycol powder (GLYCOLAX/MIRALAX) 17 GM/SCOOP powder Take 1 Container by mouth See admin instructions. Mix 17g (1 scoop) with 6 to  8 ounces of beverage and drink daily   Yes [provider]  senna-docusate (SENOKOT-S) 8.6-50 MG tablet Take 1 tablet by mouth 2 (two) times daily.   Yes [provider]  spironolactone (ALDACTONE) 100 MG tablet Take 200 mg by mouth daily.   Yes [provider]  naloxone (NARCAN) nasal spray 4 mg/0.1 mL Place 1 spray into the nose once. Patient not taking: Reported on 07/11/2021    [provider]    Allergies    Patient has no known allergies.  Review of Systems    Review of Systems  Musculoskeletal:  Positive for back pain.  Ten systems reviewed and are negative for acute change, except as noted in the HPI.    Physical Exam Updated Vital Signs BP 102/69   Pulse 71   Temp 98.1 F (36.7 C) (Rectal)   Resp 10   Ht 5\' 1"  (1.549 m)   Wt 52.2 kg   SpO2 97%   BMI 21.73 kg/m   Physical Exam Vitals and nursing note reviewed.  Constitutional:      General: She is not in acute distress.    Appearance: She is well-developed. She is not diaphoretic.     Comments: Chronically ill appearing  HENT:     Head: Normocephalic and atraumatic.     Mouth/Throat:     Comments: Absent dentition Eyes:     General: No scleral icterus.    Conjunctiva/sclera: Conjunctivae normal.  Cardiovascular:     Rate and Rhythm: Regular rhythm. Tachycardia present.     Pulses: Normal pulses.  Pulmonary:     Effort: Pulmonary effort is normal. No respiratory distress.     Breath sounds: No wheezing.     Comments: Respirations even and unlabored. Rhonchi in the R upper lobe. Abdominal:     General: There is distension.     Comments: Distended abdomen, nontender. Ascites.   Musculoskeletal:        General: Normal range of motion.     Cervical back: Normal range of motion.     Comments: TTP to the left flank without crepitus or deformity  Skin:    General: Skin is warm and dry.     Coloration: Skin is not pale.     Findings: No erythema or rash.  Neurological:     Mental Status: She is alert and oriented to person, place, and time.     Coordination: Coordination normal.     Comments: GCS 15. Moves extremities spontaneously, symmetric.  Psychiatric:        Mood and Affect: Mood is anxious. Affect is tearful.        Behavior: Behavior normal.    ED Results / Procedures / Treatments   Labs (all labs ordered are listed, but only abnormal results are displayed) Labs Reviewed  COMPREHENSIVE METABOLIC PANEL - Abnormal; Notable for the following components:       Result Value   Sodium 133 (*)    CO2 20 (*)    Glucose, Bld 107 (*)    BUN 32 (*)    Creatinine, Ser 1.58 (*)    Calcium 8.3 (*)    Total Protein 8.7 (*)    Albumin 2.4 (*)    AST 51 (*)    Total Bilirubin 2.4 (*)    GFR, Estimated 38 (*)    All other components within normal limits  CBC WITH DIFFERENTIAL/PLATELET - Abnormal; Notable for the following components:   WBC 2.9 (*)    RBC  2.30 (*)    Hemoglobin 7.7 (*)    HCT 24.1 (*)    MCV 104.8 (*)    RDW 20.9 (*)    Platelets 120 (*)    Lymphs Abs 0.6 (*)    All other components within normal limits  PROTIME-INR - Abnormal; Notable for the following components:   Prothrombin Time 17.5 (*)    INR 1.4 (*)    All other components within normal limits  CULTURE, BLOOD (ROUTINE X 2)  CULTURE, BLOOD (ROUTINE X 2)  LACTIC ACID, PLASMA  APTT  URINALYSIS, ROUTINE W REFLEX MICROSCOPIC  LIPASE, BLOOD    EKG EKG Interpretation  Date/Time:  Sunday July 11 2021 00:40:00 EST Ventricular Rate:  101 PR Interval:  135 QRS Duration: 75 QT Interval:  354 QTC Calculation: 459 R Axis:   78 Text Interpretation: Sinus tachycardia Otherwise normal ECG Confirmed by Geoffery Lyons [id number]) on 07/11/2021 2:01:19 AM  Radiology DG Chest Port 1 View  Result Date: 07/11/2021 CLINICAL DATA:  Chronic lower back pain. EXAM: PORTABLE CHEST 1 VIEW COMPARISON:  None. FINDINGS: A right-sided venous catheter is seen with its distal tip noted at the junction of the superior vena cava and right atrium. Mild linear atelectasis is present within the bilateral lung bases. There is no evidence of a pleural effusion or pneumothorax. The heart size and mediastinal contours are within normal limits. The visualized skeletal structures are unremarkable. IMPRESSION: 1. Right-sided venous catheter positioning, as described above. 2. Mild bibasilar linear atelectasis. Electronically Signed   By: Aram Candela M.D.   On: 07/11/2021 00:36    Procedures Procedures    Medications Ordered in ED Medications  LORazepam (ATIVAN) injection 0.5-1 mg (has no administration in time range)  sodium chloride 0.9 % bolus 1,000 mL (0 mLs Intravenous Stopped 07/11/21 0401)  HYDROmorphone (DILAUDID) injection 0.5 mg (0.5 mg Intravenous Given 07/11/21 0249)    ED Course  I have reviewed the triage vital signs and the nursing notes.  Pertinent labs & imaging results that were available during my care of the patient were reviewed by me and considered in my medical decision making (see chart for details).  Clinical Course as of 07/11/21 7262  [facility] Jul 11, 2021  0355 Per Care Everywhere labs as of 07/07/21: WBC 2.6 Hgb 7.1 Plt 107 Na 133 BUN 44 Cr 1.63  Creatinine at time of hospital discharge was 0.8. Hgb during hospitalization down trending; 10.5>9.1>7.5>9.3>8.6>8.1 at time of discharge. Bicytopenia (anemia, thrombocytopenia) felt related to liver disease. [KH]  0216 Some interval improvement to creatinine since 07/07/21, but overall stable. Leukopenia and anemia, thrombocytopenia also stable in comparison to most recent outpatient labs. INR elevation suspected secondary to liver disease. [KH]  0219 Attempted contact made to [facility] for collateral without answer. Call did not roll to voicemail and automatically disconnected. [KH]  0223 Attempted [facility] again with success. Per staff, patient was yelling and screaming from 1900 until ambulance came tonight mostly with c/o back pain. RN reports patient "talking out of her head" and wasn't able to settle despite getting her Suboxone; this is reportedly abnormal for her. RN also reports temporal temperature of 101F at their facility, but patient "wasn't given anything for fever because of her liver".  [KH]  0307 UA reassuring. VSS. [KH]  0641 HR has continued to improve. Pending MRIs at this time to assess for infection burden and potential disease progression. Appears patient had only MR C-spine when at OSH  for admission. Pain today is in  her low back which may be related to lower lumbar lesions. Dispo per imaging. [KH]    Clinical Course User Index [KH] Antony Madura, PA-C   MDM Rules/Calculators/A&P                           55 year old female with multiple medical issues, most recently hospitalization for sepsis secondary to MRSA bacteremia related to osteomyelitis/discitis from C5-C7 presenting from [facility] skilled nursing facility for complaints of ongoing back pain.  They report the patient was inconsolable despite use of her prescribed medications.  She also was reported to have had a fever of 101 F at their facility, but was not given any antipyretics.  She has not been febrile since arrival in the ED.  Does meet criteria for SIRS given heart rate over 90, leukopenia; however, leukopenia is relatively stable in comparison to outpatient labs from 4 days ago.  Hemoglobin and creatinine have also had very mild interval improvement.  The patient's pain is primarily in her left low back.  It is nonradiating and noted to be reproducible on palpation.  While the patient reports this to be chronic and it may be musculoskeletal in etiology, it is unclear whether or not she may have increased disease burden related to her recent diagnosis of osteomyelitis.  She has been undergoing IV daptomycin infusions at her skilled nursing facility for this.  If there is concern for infection progression, may warrant admission and consultation to infectious disease; possible biopsy.  Care signed out to [place], PA-C at change of shift pending MRI C/T/L-spine with and without contrast.   Final Clinical Impression(s) / ED Diagnoses Final diagnoses:  Left-sided low back pain without sciatica, unspecified chronicity  Osteomyelitis of cervical spine (HCC)  SIRS (systemic inflammatory response syndrome) (HCC)  Cirrhosis of liver with ascites, unspecified hepatic cirrhosis type [facility])    Rx / DC Orders ED  Discharge Orders     None        Antony Madura, PA-C 07/11/21 8469    Geoffery Lyons, MD 07/11/21 [phone]

## 2021-07-11 NOTE — Progress Notes (Signed)
A consult was received from an ED provider for vancomycin per pharmacy dosing.  The patient's profile has been reviewed for ht/wt/allergies/indication/available labs.    No Known Allergies  A one time order has been placed for vancomycin 1000 mg IV.  Further antibiotics/pharmacy consults should be ordered by admitting physician if indicated.                       Thank you, Cindi Carbon, PharmD 07/11/2021  12:50 PM

## 2021-07-11 NOTE — ED Triage Notes (Signed)
Pt BIB [facility] from [facility] c/o chronic lower back pain x4 hours. Pt denies injury. Pt has a hx of liver cirrhosis, Hep C, and a Spinal infection. T-101 HR 120, then 106 RR 24 BP 136/76 O2 97% r/a

## 2021-07-11 NOTE — ED Provider Notes (Signed)
Care assumed from previous provider PA [facility]. Please see note for further details.  In short patient is a 55 year old female with liver cirrhosis presenting from her SNF due to abnormal behavior.  She was placed in a SNF due to being found to be septic from MRSA secondary to osteomyelitis on the fifth.  She is being treated with IV daptomycin.  Recently she began to complain of back pain in the left lumbar region which is not consistent with her osteomyelitis and discitis.  Tresa Endo spoke with the hospitalist team who recommended an MRI to assure no progression of her disease.  If MRI is stable she potentially can be discharged back to her SNF.  If worsening she will be admitted with an infectious disease consult.  Physical Exam  BP 102/69   Pulse 71   Temp 98.1 F (36.7 C) (Rectal)   Resp 10   Ht 5\' 1"  (1.549 m)   Wt 52.2 kg   SpO2 97%   BMI 21.73 kg/m   Physical Exam Vitals and nursing note reviewed.  Constitutional:      Appearance: Normal appearance.  HENT:     Head: Normocephalic and atraumatic.  Eyes:     General: No scleral icterus.    Conjunctiva/sclera: Conjunctivae normal.  Pulmonary:     Effort: Pulmonary effort is normal. No respiratory distress.  Skin:    Findings: No erythema or rash.  Neurological:     Mental Status: She is alert.  Psychiatric:        Mood and Affect: Mood normal.    ED Course/Procedures   Clinical Course as of 07/11/21 0715  Sun Jul 11, 2021  Jul 13, 2021 Per Care Everywhere labs as of 07/07/21: WBC 2.6 Hgb 7.1 Plt 107 Na 133 BUN 44 Cr 1.63  Creatinine at time of hospital discharge was 0.8. Hgb during hospitalization down trending; 10.5>9.1>7.5>9.3>8.6>8.1 at time of discharge. Bicytopenia (anemia, thrombocytopenia) felt related to liver disease. [KH]  0216 Some interval improvement to creatinine since 07/07/21, but overall stable. Leukopenia and anemia, thrombocytopenia also stable in comparison to most recent outpatient labs. INR elevation  suspected secondary to liver disease. [KH]  0219 Attempted contact made to [facility] for collateral without answer. Call did not roll to voicemail and automatically disconnected. [KH]  0223 Attempted [facility] again with success. Per staff, patient was yelling and screaming from 1900 until ambulance came tonight mostly with c/o back pain. RN reports patient "talking out of her head" and wasn't able to settle despite getting her Suboxone; this is reportedly abnormal for her. RN also reports temporal temperature of 101F at their facility, but patient "wasn't given anything for fever because of her liver".  [KH]  0307 UA reassuring. VSS. [KH]  0641 HR has continued to improve. Pending MRIs at this time to assess for infection burden and potential disease progression. Appears patient had only MR C-spine when at OSH for admission. Pain today is in her low back which may be related to lower lumbar lesions. Dispo per imaging. [KH]  0715 MR Lumbar Spine W Wo Contrast [MR]    Clinical Course User Index [KH] 07-30-1992, PA-C [MR] Wilbern Pennypacker A, PA-C    MRI scanning showed worsening osteomyelitis and discitis of the cervical spine.  Now with the same in the lumbar spine and suspicious abscesses.  Neurosurgery has been consulted, likely admission for continued antibiotic treatment and pain control.  11:20 neurosurgery reports that they do not manage this unless there is an epidural component, there  is not on patient's scan.  Hospital admission pH.  Infectious disease physician MD Daiva Eves recommends IV vancomycin instead of daptomycin at this time.    MDM  MRI impression:  IMPRESSION:  1. Findings consistent with discitis and osteomyelitis at C6-7. No  associated cervical paraspinal or epidural abscess or mass effect on  the cord.  2. Advanced discitis and osteomyelitis at L1-2 with associated  pathologic fracture and extensive paraspinal and epidural  inflammatory changes. There is a  small probable abscess within the  right psoas muscle and mass effect on the thecal sac and both neural  foramina. No cord compression or drainable epidural fluid  collections identified.  3. No significant thoracic spine findings.  4. Multilevel cervical spondylosis as described with foraminal  narrowing bilaterally from C3-4 through C6-7 due to spondylosis.  5. Anasarca with generalized soft tissue edema, ascites and small  bilateral pleural effusions.   Per chart review with the discitis/osteomyelitis and pathologic fracture were not present in the lumbar spine with [facility].  Patient to be admitted to [facility].  Accepting physician is MD Ronaldo Miyamoto.    Saddie Benders, PA-C 07/11/21 1246    Tanda Rockers A, DO 07/11/21 1615

## 2021-07-11 NOTE — ED Notes (Signed)
Patient transported to MRI 

## 2021-07-12 ENCOUNTER — Inpatient Hospital Stay: Payer: Self-pay

## 2021-07-12 DIAGNOSIS — M4640 Discitis, unspecified, site unspecified: Secondary | ICD-10-CM

## 2021-07-12 DIAGNOSIS — B182 Chronic viral hepatitis C: Secondary | ICD-10-CM

## 2021-07-12 DIAGNOSIS — K7460 Unspecified cirrhosis of liver: Secondary | ICD-10-CM

## 2021-07-12 DIAGNOSIS — D649 Anemia, unspecified: Secondary | ICD-10-CM

## 2021-07-12 DIAGNOSIS — K7469 Other cirrhosis of liver: Secondary | ICD-10-CM

## 2021-07-12 DIAGNOSIS — M4622 Osteomyelitis of vertebra, cervical region: Secondary | ICD-10-CM

## 2021-07-12 DIAGNOSIS — N179 Acute kidney failure, unspecified: Secondary | ICD-10-CM | POA: Diagnosis present

## 2021-07-12 DIAGNOSIS — D61818 Other pancytopenia: Secondary | ICD-10-CM

## 2021-07-12 DIAGNOSIS — R188 Other ascites: Secondary | ICD-10-CM

## 2021-07-12 LAB — FOLATE: Folate: 14 ng/mL (ref >5.9)

## 2021-07-12 LAB — IRON AND TIBC
Iron: 105 ug/dL (ref 28–170)
Saturation Ratios: 44 % — ABNORMAL HIGH (ref 10.4–31.8)
TIBC: 240 ug/dL — ABNORMAL LOW (ref 250–450)
UIBC: 135 ug/dL

## 2021-07-12 LAB — CBC
HCT: 19 % — ABNORMAL LOW (ref 36.0–46.0)
Hemoglobin: 6.1 g/dL — CL (ref 12.0–15.0)
MCH: 33.3 pg (ref 26.0–34.0)
MCHC: 32.1 g/dL (ref 30.0–36.0)
MCV: 103.8 fL — ABNORMAL HIGH (ref 80.0–100.0)
Platelets: 112 10*3/uL — ABNORMAL LOW (ref 150–400)
RBC: 1.83 MIL/uL — ABNORMAL LOW (ref 3.87–5.11)
RDW: 20.2 % — ABNORMAL HIGH (ref 11.5–15.5)
WBC: 2.3 10*3/uL — ABNORMAL LOW (ref 4.0–10.5)
nRBC: 0 % (ref 0.0–0.2)

## 2021-07-12 LAB — COMPREHENSIVE METABOLIC PANEL
ALT: 21 U/L (ref 0–44)
AST: 38 U/L (ref 15–41)
Albumin: 2.1 g/dL — ABNORMAL LOW (ref 3.5–5.0)
Alkaline Phosphatase: 58 U/L (ref 38–126)
Anion gap: 6 (ref 5–15)
BUN: 23 mg/dL — ABNORMAL HIGH (ref 6–20)
CO2: 19 mmol/L — ABNORMAL LOW (ref 22–32)
Calcium: 7.9 mg/dL — ABNORMAL LOW (ref 8.9–10.3)
Chloride: 108 mmol/L (ref 98–111)
Creatinine, Ser: 1.12 mg/dL — ABNORMAL HIGH (ref 0.44–1.00)
GFR, Estimated: 57 mL/min — ABNORMAL LOW (ref >60)
Glucose, Bld: 139 mg/dL — ABNORMAL HIGH (ref 70–99)
Potassium: 4.1 mmol/L (ref 3.5–5.1)
Sodium: 133 mmol/L — ABNORMAL LOW (ref 135–145)
Total Bilirubin: 1.8 mg/dL — ABNORMAL HIGH (ref 0.3–1.2)
Total Protein: 7.3 g/dL (ref 6.5–8.1)

## 2021-07-12 LAB — ABO/RH: ABO/RH(D): A NEG

## 2021-07-12 LAB — PREPARE RBC (CROSSMATCH)

## 2021-07-12 LAB — PROCALCITONIN: Procalcitonin: 0.24 ng/mL

## 2021-07-12 LAB — RETICULOCYTES
Immature Retic Fract: 8.9 % (ref 2.3–15.9)
RBC.: 1.89 MIL/uL — ABNORMAL LOW (ref 3.87–5.11)
Retic Count, Absolute: 116.6 10*3/uL (ref 19.0–186.0)
Retic Ct Pct: 6.2 % — ABNORMAL HIGH (ref 0.4–3.1)

## 2021-07-12 LAB — PROTIME-INR
INR: 1.5 — ABNORMAL HIGH (ref 0.8–1.2)
Prothrombin Time: 18.5 seconds — ABNORMAL HIGH (ref 11.4–15.2)

## 2021-07-12 LAB — FERRITIN: Ferritin: 241 ng/mL (ref 11–307)

## 2021-07-12 LAB — CORTISOL-AM, BLOOD: Cortisol - AM: 8.2 ug/dL (ref 6.7–22.6)

## 2021-07-12 LAB — CK: Total CK: 22 U/L — ABNORMAL LOW (ref 38–234)

## 2021-07-12 LAB — VITAMIN B12: Vitamin B-12: 919 pg/mL — ABNORMAL HIGH (ref 180–914)

## 2021-07-12 MED ORDER — ACETAMINOPHEN 325 MG PO TABS
650.0000 mg | ORAL_TABLET | Freq: Once | ORAL | Status: AC
  Administered 2021-07-12: 11:00:00 650 mg via ORAL
  Filled 2021-07-12: qty 2

## 2021-07-12 MED ORDER — HYDROMORPHONE HCL 1 MG/ML IJ SOLN
1.0000 mg | INTRAMUSCULAR | Status: DC | PRN
  Administered 2021-07-12 – 2021-07-13 (×3): 1 mg via INTRAVENOUS
  Filled 2021-07-12 (×4): qty 1

## 2021-07-12 MED ORDER — BUPRENORPHINE HCL-NALOXONE HCL 2-0.5 MG SL SUBL
2.0000 | SUBLINGUAL_TABLET | Freq: Two times a day (BID) | SUBLINGUAL | Status: DC
  Administered 2021-07-12 – 2021-07-19 (×15): 2 via SUBLINGUAL
  Filled 2021-07-12 (×5): qty 2
  Filled 2021-07-12: qty 1
  Filled 2021-07-12 (×2): qty 2
  Filled 2021-07-12: qty 1
  Filled 2021-07-12 (×5): qty 2
  Filled 2021-07-12: qty 1
  Filled 2021-07-12: qty 2
  Filled 2021-07-12: qty 1

## 2021-07-12 MED ORDER — SPIRONOLACTONE 100 MG PO TABS
100.0000 mg | ORAL_TABLET | Freq: Every day | ORAL | Status: DC
  Administered 2021-07-12 – 2021-07-15 (×4): 100 mg via ORAL
  Filled 2021-07-12 (×4): qty 1

## 2021-07-12 MED ORDER — FUROSEMIDE 40 MG PO TABS
40.0000 mg | ORAL_TABLET | Freq: Every day | ORAL | Status: DC
  Administered 2021-07-12 – 2021-07-15 (×4): 40 mg via ORAL
  Filled 2021-07-12 (×4): qty 1

## 2021-07-12 MED ORDER — SODIUM CHLORIDE 0.9% IV SOLUTION: Freq: Once | INTRAVENOUS | Status: AC

## 2021-07-12 MED ORDER — ALBUMIN HUMAN 25 % IV SOLN
25.0000 g | Freq: Once | INTRAVENOUS | Status: DC
  Filled 2021-07-12: qty 100

## 2021-07-12 MED ORDER — HYDROMORPHONE HCL 1 MG/ML IJ SOLN: 0.5000 mg | Freq: Once | INTRAMUSCULAR | Status: DC

## 2021-07-12 MED ORDER — RIFAXIMIN 550 MG PO TABS
550.0000 mg | ORAL_TABLET | Freq: Two times a day (BID) | ORAL | Status: DC
  Administered 2021-07-12 – 2021-07-19 (×14): 550 mg via ORAL
  Filled 2021-07-12 (×16): qty 1

## 2021-07-12 MED ORDER — LACTULOSE 10 GM/15ML PO SOLN
20.0000 g | Freq: Two times a day (BID) | ORAL | Status: DC
  Administered 2021-07-12 – 2021-07-13 (×3): 20 g via ORAL
  Filled 2021-07-12 (×3): qty 30

## 2021-07-12 MED ORDER — DIPHENHYDRAMINE HCL 25 MG PO CAPS
25.0000 mg | ORAL_CAPSULE | Freq: Once | ORAL | Status: AC
  Administered 2021-07-12: 11:00:00 25 mg via ORAL
  Filled 2021-07-12: qty 1

## 2021-07-12 NOTE — Progress Notes (Signed)
[facility]    Date of Admission:  07/10/2021   Total days of antibiotics 2/dapto 1           ID: Christina Wyatt is a 55 y.o. female with hx of hep C/ETOH cirrhosis -MRSA bacteremia and cervical-lumbar osteomyelitis (right psoas abscess) being treated from recent [facility] admission, but has been in receiving care in SNF Principal Problem:   Diskitis Active Problems:   Sepsis (HCC)   IVDU (intravenous drug user)   MRSA bacteremia    Subjective: Has ongoing back pain. Feel that her abdomen is tight.  Medications:   buprenorphine-naloxone  2 tablet Sublingual BID   Chlorhexidine Gluconate Cloth  6 each Topical Daily   furosemide  40 mg Oral Daily   gabapentin  200 mg Oral TID   lactulose  20 g Oral BID   methocarbamol  500 mg Oral TID   multivitamin with minerals  1 tablet Oral Daily   rifaximin  550 mg Oral BID   sodium chloride flush  10-40 mL Intracatheter Q12H   spironolactone  100 mg Oral Daily    Objective: Vital signs in last 24 hours: Temp:  [97.5 F (36.4 C)-98.3 F (36.8 C)] 97.9 F (36.6 C) (11/28 1433) Pulse Rate:  [86-100] 90 (11/28 1433) Resp:  [12-20] 12 (11/28 1433) BP: (106-169)/(68-90) 127/77 (11/28 1433) SpO2:  [98 %-100 %] 98 % (11/28 1433) Physical Exam  Constitutional:  oriented to person, place, and time. appears much older than stated age and frail. No distress.  HENT: [place]/AT, PERRLA, no scleral icterus Mouth/Throat: Oropharynx is clear and moist. No oropharyngeal exudate.  Cardiovascular: Normal rate, regular rhythm and normal heart sounds. Exam reveals no gallop and no friction rub.  No murmur heard.  Pulmonary/Chest: Effort normal and breath sounds normal. No respiratory distress.  has no wheezes.  Neck = supple, no nuchal rigidity Abdominal: Soft. Bowel sounds are normal. Protuberant abdomen.+ fluid wave Lymphadenopathy: no cervical adenopathy. No axillary adenopathy Neurological: alert and oriented to person,  place, and time.  Skin: Skin is warm and dry. No rash noted. No erythema.  Psychiatric: a normal mood and affect.  behavior is normal.    Lab Results Recent Labs    07/11/21 0132 07/12/21 0339  WBC 2.9* 2.3*  HGB 7.7* 6.1*  HCT 24.1* 19.0*  NA 133* 133*  K 4.3 4.1  CL 106 108  CO2 20* 19*  BUN 32* 23*  CREATININE 1.58* 1.12*   Liver Panel Recent Labs    07/11/21 0132 07/12/21 0339  PROT 8.7* 7.3  ALBUMIN 2.4* 2.1*  AST 51* 38  ALT 25 21  ALKPHOS 77 58  BILITOT 2.4* 1.8*   Sedimentation Rate No results for input(s): ESRSEDRATE in the last 72 hours. C-Reactive Protein No results for input(s): CRP in the last 72 hours.  Microbiology: 11/27 blood cx NGTD Studies/Results: MR Cervical Spine W or Wo Contrast  Result Date: 07/11/2021 CLINICAL DATA:  Mid to low back pain. Reported findings of discitis/osteomyelitis on outside MRI 06/26/2021. history of drug abuse. EXAM: MRI CERVICAL, THORACIC AND LUMBAR SPINE WITHOUT AND WITH CONTRAST TECHNIQUE: Multiplanar and multiecho pulse sequences of the cervical spine, to include the craniocervical junction and cervicothoracic junction, and thoracic and lumbar spine, were obtained without and with intravenous contrast. CONTRAST:  33mL GADAVIST GADOBUTROL 1 MMOL/ML IV SOLN COMPARISON:  Portable chest radiograph same date. No previous outside imaging or reports available. FINDINGS: MRI CERVICAL SPINE FINDINGS Alignment: Straightening without focal angulation  or significant listhesis. Vertebrae: There is mild T2 hyperintensity within the C6-7 disc. There is adjacent endplate T2 hyperintensity, T1 hypointensity and diffuse marrow enhancement within the C6 and C7 vertebral bodies consistent with osteomyelitis. No significant pathologic fracture. No other significant marrow signal abnormalities in the cervical spine. Cord: Normal in signal and caliber. No abnormal intradural enhancement. Posterior Fossa, vertebral arteries, paraspinal tissues: The  visualized craniocervical junction appears normal. There are bilateral vertebral artery flow voids. There are anterior paraspinal inflammatory changes within the prevertebral soft tissues at C6-7 without focal fluid collection. No epidural fluid collections are seen. Disc levels: Multilevel cervical spondylosis with disc bulging, uncinate spurring and facet hypertrophy. There is no cord compression, although multilevel mild-to-moderate foraminal narrowing is present from C3-4 through C6-7. As above, findings of discitis and early osteomyelitis at C6-7 without associated epidural fluid collection. There are anterior paraspinal inflammatory changes with heterogeneous enhancement, but no focal fluid collection. MRI THORACIC SPINE FINDINGS Alignment:  Normal. Vertebrae: No evidence of acute fracture, discitis or osteomyelitis within the thoracic spine. Cord: Normal in signal and caliber. No abnormal intradural enhancement. Paraspinal and other soft tissues: The thoracic paraspinal soft tissues appear unremarkable. Trace bilateral pleural effusions. Patchy pulmonary opacities are noted, suboptimally evaluated by MRI, but likely reflecting atelectasis or edema based on earlier chest radiographs. Disc levels: The thoracic discs appear normal. No disc herniation, spinal stenosis or nerve root encroachment identified in the thoracic spine. MRI LUMBAR SPINE FINDINGS Segmentation:  There are 5 lumbar type vertebral bodies. Alignment: Trace retrolisthesis at L1-2 and anterolisthesis at L5-S1. Vertebrae: There are changes of advanced discitis and osteomyelitis at L1-2. There is endplate destruction, loss of vertebral body height and diffuse marrow enhancement throughout the L1 and L2 vertebral bodies. The intervening disc demonstrates heterogeneous T2 hyperintensity and enhancement. No additional levels of discitis/osteomyelitis identified. No definite infected facet joints are seen. Conus medullaris and cauda equina: Conus  extends to the L1-2 level. There is mass effect on the thecal sac by anterior epidural inflammation at L1-2, but no gross conus compression or abnormal intradural enhancement. Paraspinal and other soft tissues: Extensive paraspinal inflammatory changes at L1-2 with a small peripherally enhancing fluid collection within the right psoas muscle adjacent to the L1 vertebral body, measuring approximately 2.2 x 0.8 x 1.5 cm. No other focal paraspinal fluid collections are identified. There is anterior epidural inflammation at L1-2 extending into both foramina. No epidural fluid collection identified. The spleen is incompletely imaged, although appears moderately enlarged. There is generalized soft tissue edema with evidence of at least moderate ascites. Disc levels: T12-L1: Normal interspace. L1-2: Advanced changes of diskitis and osteomyelitis with annular disc bulging and extensive anterior epidural inflammation extending into both foramina. There is moderate mass effect on the thecal sac with moderate foraminal narrowing bilaterally, worse on the left. L2-3: Normal interspace. L3-4: Normal interspace. L4-5: Disc height and hydration are maintained. Mild facet and ligamentous hypertrophy. No significant spinal stenosis. L5-S1: Mild disc bulging with endplate osteophytes asymmetric to the right. Mild bilateral facet hypertrophy. Mild right foraminal narrowing. IMPRESSION: 1. Findings consistent with discitis and osteomyelitis at C6-7. No associated cervical paraspinal or epidural abscess or mass effect on the cord. 2. 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 There is a small probable abscess within the right psoas muscle and mass effect on the thecal sac and both neural foramina. No cord compression or drainable epidural fluid collections identified. 3. No significant thoracic  spine findings. 4. Multilevel cervical  spondylosis as described with foraminal narrowing bilaterally from C3-4 through C6-7 due to spondylosis. 5. Anasarca with generalized soft tissue edema, ascites and small bilateral pleural effusions. Electronically Signed   By: Richardean Sale M.D.   On: 07/11/2021 10:43   MR THORACIC SPINE W WO CONTRAST  Result Date: 07/11/2021 CLINICAL DATA:  Mid to low back pain. Reported findings of discitis/osteomyelitis on outside MRI 06/26/2021. history of drug abuse. EXAM: MRI CERVICAL, THORACIC AND LUMBAR SPINE WITHOUT AND WITH CONTRAST TECHNIQUE: Multiplanar and multiecho pulse sequences of the cervical spine, to include the craniocervical junction and cervicothoracic junction, and thoracic and lumbar spine, were obtained without and with intravenous contrast. CONTRAST:  35mL GADAVIST GADOBUTROL 1 MMOL/ML IV SOLN COMPARISON:  Portable chest radiograph same date. No previous outside imaging or reports available. FINDINGS: MRI CERVICAL SPINE FINDINGS Alignment: Straightening without focal angulation or significant listhesis. Vertebrae: There is mild T2 hyperintensity within the C6-7 disc. There is adjacent endplate T2 hyperintensity, T1 hypointensity and diffuse marrow enhancement within the C6 and C7 vertebral bodies consistent with osteomyelitis. No significant pathologic fracture. No other significant marrow signal abnormalities in the cervical spine. Cord: Normal in signal and caliber. No abnormal intradural enhancement. Posterior Fossa, vertebral arteries, paraspinal tissues: The visualized craniocervical junction appears normal. There are bilateral vertebral artery flow voids. There are anterior paraspinal inflammatory changes within the prevertebral soft tissues at C6-7 without focal fluid collection. No epidural fluid collections are seen. Disc levels: Multilevel cervical spondylosis with disc bulging, uncinate spurring and facet hypertrophy. There is no cord compression, although multilevel mild-to-moderate  foraminal narrowing is present from C3-4 through C6-7. As above, findings of discitis and early osteomyelitis at C6-7 without associated epidural fluid collection. There are anterior paraspinal inflammatory changes with heterogeneous enhancement, but no focal fluid collection. MRI THORACIC SPINE FINDINGS Alignment:  Normal. Vertebrae: No evidence of acute fracture, discitis or osteomyelitis within the thoracic spine. Cord: Normal in signal and caliber. No abnormal intradural enhancement. Paraspinal and other soft tissues: The thoracic paraspinal soft tissues appear unremarkable. Trace bilateral pleural effusions. Patchy pulmonary opacities are noted, suboptimally evaluated by MRI, but likely reflecting atelectasis or edema based on earlier chest radiographs. Disc levels: The thoracic discs appear normal. No disc herniation, spinal stenosis or nerve root encroachment identified in the thoracic spine. MRI LUMBAR SPINE FINDINGS Segmentation:  There are 5 lumbar type vertebral bodies. Alignment: Trace retrolisthesis at L1-2 and anterolisthesis at L5-S1. Vertebrae: There are changes of advanced discitis and osteomyelitis at L1-2. There is endplate destruction, loss of vertebral body height and diffuse marrow enhancement throughout the L1 and L2 vertebral bodies. The intervening disc demonstrates heterogeneous T2 hyperintensity and enhancement. No additional levels of discitis/osteomyelitis identified. No definite infected facet joints are seen. Conus medullaris and cauda equina: Conus extends to the L1-2 level. There is mass effect on the thecal sac by anterior epidural inflammation at L1-2, but no gross conus compression or abnormal intradural enhancement. Paraspinal and other soft tissues: Extensive paraspinal inflammatory changes at L1-2 with a small peripherally enhancing fluid collection within the right psoas muscle adjacent to the L1 vertebral body, measuring approximately 2.2 x 0.8 x 1.5 cm. No other focal  paraspinal fluid collections are identified. There is anterior epidural inflammation at L1-2 extending into both foramina. No epidural fluid collection identified. The spleen is incompletely imaged, although appears moderately enlarged. There is generalized soft tissue edema with evidence of at least moderate ascites. Disc levels: T12-L1: Normal interspace. L1-2: Advanced  changes of diskitis and osteomyelitis with annular disc bulging and extensive anterior epidural inflammation extending into both foramina. There is moderate mass effect on the thecal sac with moderate foraminal narrowing bilaterally, worse on the left. L2-3: Normal interspace. L3-4: Normal interspace. L4-5: Disc height and hydration are maintained. Mild facet and ligamentous hypertrophy. No significant spinal stenosis. L5-S1: Mild disc bulging with endplate osteophytes asymmetric to the right. Mild bilateral facet hypertrophy. Mild right foraminal narrowing. IMPRESSION: 1. Findings consistent with discitis and osteomyelitis at C6-7. No associated cervical paraspinal or epidural abscess or mass effect on the cord. 2. 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 There is a small probable abscess within the right psoas muscle and mass effect on the thecal sac and both neural foramina. No cord compression or drainable epidural fluid collections identified. 3. No significant thoracic spine findings. 4. Multilevel cervical spondylosis as described with foraminal narrowing bilaterally from C3-4 through C6-7 due to spondylosis. 5. Anasarca with generalized soft tissue edema, ascites and small bilateral pleural effusions. Electronically Signed   By: Richardean Sale M.D.   On: 07/11/2021 10:43   MR Lumbar Spine W Wo Contrast  Result Date: 07/11/2021 CLINICAL DATA:  Mid to low back pain. Reported findings of discitis/osteomyelitis on outside MRI 06/26/2021.  history of drug abuse. EXAM: MRI CERVICAL, THORACIC AND LUMBAR SPINE WITHOUT AND WITH CONTRAST TECHNIQUE: Multiplanar and multiecho pulse sequences of the cervical spine, to include the craniocervical junction and cervicothoracic junction, and thoracic and lumbar spine, were obtained without and with intravenous contrast. CONTRAST:  65mL GADAVIST GADOBUTROL 1 MMOL/ML IV SOLN COMPARISON:  Portable chest radiograph same date. No previous outside imaging or reports available. FINDINGS: MRI CERVICAL SPINE FINDINGS Alignment: Straightening without focal angulation or significant listhesis. Vertebrae: There is mild T2 hyperintensity within the C6-7 disc. There is adjacent endplate T2 hyperintensity, T1 hypointensity and diffuse marrow enhancement within the C6 and C7 vertebral bodies consistent with osteomyelitis. No significant pathologic fracture. No other significant marrow signal abnormalities in the cervical spine. Cord: Normal in signal and caliber. No abnormal intradural enhancement. Posterior Fossa, vertebral arteries, paraspinal tissues: The visualized craniocervical junction appears normal. There are bilateral vertebral artery flow voids. There are anterior paraspinal inflammatory changes within the prevertebral soft tissues at C6-7 without focal fluid collection. No epidural fluid collections are seen. Disc levels: Multilevel cervical spondylosis with disc bulging, uncinate spurring and facet hypertrophy. There is no cord compression, although multilevel mild-to-moderate foraminal narrowing is present from C3-4 through C6-7. As above, findings of discitis and early osteomyelitis at C6-7 without associated epidural fluid collection. There are anterior paraspinal inflammatory changes with heterogeneous enhancement, but no focal fluid collection. MRI THORACIC SPINE FINDINGS Alignment:  Normal. Vertebrae: No evidence of acute fracture, discitis or osteomyelitis within the thoracic spine. Cord: Normal in signal and  caliber. No abnormal intradural enhancement. Paraspinal and other soft tissues: The thoracic paraspinal soft tissues appear unremarkable. Trace bilateral pleural effusions. Patchy pulmonary opacities are noted, suboptimally evaluated by MRI, but likely reflecting atelectasis or edema based on earlier chest radiographs. Disc levels: The thoracic discs appear normal. No disc herniation, spinal stenosis or nerve root encroachment identified in the thoracic spine. MRI LUMBAR SPINE FINDINGS Segmentation:  There are 5 lumbar type vertebral bodies. Alignment: Trace retrolisthesis at L1-2 and anterolisthesis at L5-S1. Vertebrae: There are changes of advanced discitis and osteomyelitis at L1-2. There is endplate destruction, loss of vertebral body height and diffuse marrow enhancement throughout  the L1 and L2 vertebral bodies. The intervening disc demonstrates heterogeneous T2 hyperintensity and enhancement. No additional levels of discitis/osteomyelitis identified. No definite infected facet joints are seen. Conus medullaris and cauda equina: Conus extends to the L1-2 level. There is mass effect on the thecal sac by anterior epidural inflammation at L1-2, but no gross conus compression or abnormal intradural enhancement. Paraspinal and other soft tissues: Extensive paraspinal inflammatory changes at L1-2 with a small peripherally enhancing fluid collection within the right psoas muscle adjacent to the L1 vertebral body, measuring approximately 2.2 x 0.8 x 1.5 cm. No other focal paraspinal fluid collections are identified. There is anterior epidural inflammation at L1-2 extending into both foramina. No epidural fluid collection identified. The spleen is incompletely imaged, although appears moderately enlarged. There is generalized soft tissue edema with evidence of at least moderate ascites. Disc levels: T12-L1: Normal interspace. L1-2: Advanced changes of diskitis and osteomyelitis with annular disc bulging and extensive  anterior epidural inflammation extending into both foramina. There is moderate mass effect on the thecal sac with moderate foraminal narrowing bilaterally, worse on the left. L2-3: Normal interspace. L3-4: Normal interspace. L4-5: Disc height and hydration are maintained. Mild facet and ligamentous hypertrophy. No significant spinal stenosis. L5-S1: Mild disc bulging with endplate osteophytes asymmetric to the right. Mild bilateral facet hypertrophy. Mild right foraminal narrowing. IMPRESSION: 1. Findings consistent with discitis and osteomyelitis at C6-7. No associated cervical paraspinal or epidural abscess or mass effect on the cord. 2. 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 There is a small probable abscess within the right psoas muscle and mass effect on the thecal sac and both neural foramina. No cord compression or drainable epidural fluid collections identified. 3. No significant thoracic spine findings. 4. Multilevel cervical spondylosis as described with foraminal narrowing bilaterally from C3-4 through C6-7 due to spondylosis. 5. Anasarca with generalized soft tissue edema, ascites and small bilateral pleural effusions. Electronically Signed   By: Richardean Sale M.D.   On: 07/11/2021 10:43   US Abdomen Complete  Result Date: 07/11/2021 CLINICAL DATA:  Liver failure.  Cirrhosis. EXAM: ABDOMEN ULTRASOUND COMPLETE COMPARISON:  None. FINDINGS: Gallbladder: No gallstones or wall thickening visualized. No sonographic Murphy sign noted by sonographer. Common bile duct: Diameter: 5 mm Liver: Cirrhosis. Portal vein is patent on color Doppler imaging with normal direction of blood flow towards the liver. IVC: No abnormality visualized. Pancreas: Visualized portion unremarkable. Spleen: Splenomegaly measuring 19 cm in length. Right Kidney: Length: 10 cm. Echogenicity within normal limits. No mass or  hydronephrosis visualized. Left Kidney: Length: 10 cm. Echogenicity within normal limits. No mass or hydronephrosis visualized. Abdominal aorta: No aneurysm visualized. Other findings: There is a small ascites. IMPRESSION: 1. Cirrhosis with evidence of portal hypertension, splenomegaly, and ascites. 2. Patent main portal vein with hepatopetal flow. Electronically Signed   By: Anner Crete M.D.   On: 07/11/2021 22:23   DG Chest Port 1 View  Result Date: 07/11/2021 CLINICAL DATA:  Chronic lower back pain. EXAM: PORTABLE CHEST 1 VIEW COMPARISON:  None. FINDINGS: A right-sided venous catheter is seen with its distal tip noted at the junction of the superior vena cava and right atrium. Mild linear atelectasis is present within the bilateral lung bases. There is no evidence of a pleural effusion or pneumothorax. The heart size and mediastinal contours are within normal limits. The visualized skeletal structures are unremarkable. IMPRESSION: 1. Right-sided venous catheter positioning, as described above. 2. Mild  bibasilar linear atelectasis. Electronically Signed   By: Virgina Norfolk M.D.   On: 07/11/2021 00:36   [place] EKG SITE RITE  Result Date: 07/12/2021 If Site Rite image not attached, placement could not be confirmed due to current cardiac rhythm.    Assessment/Plan: MRSA bacteremia/cervical-lumbar osteomyelitis = will continue to daptomycin avoid nephrotoxicity  Ascites = recommend to do paracentesis. Cell count/cx and see if needs SBP proph  Back pain = defer to primary team  [facility] Pager: [phone]  07/12/2021, 5:00 PM

## 2021-07-12 NOTE — Progress Notes (Signed)
PROGRESS NOTE    Christina Wyatt  YQM:[id number] DOB: December 02, 1965 DOA: 07/10/2021 PCP: Georgann Housekeeper, MD (Confirm with patient/family/NH records and if not entered, this HAS to be entered at [facility] point of entry. "No PCP" if truly none.)   Chief Complaint  Patient presents with   Back Pain    Lower Back x4 hrs    Brief Narrative: (Start on day 1 of progress note - keep it brief and live) Patient 55 year old female history of hep C, polysubstance abuse, bipolar disorder, cirrhosis, who was admitted to [facility] on 11/5 with worsening abdominal pain, lower extremity swelling, decreased appetite, worsening chronic back pain with subjective fevers and chills as well as shortness of breath and tachypnea.  Patient admitted at [facility] hospital with concerns for sepsis noted to have a bacteremia which grew MRSA, patient underwent MRI of the C-spine which showed discitis and osteomyelitis at C5, C6-C7, epidural thickening from C5-C6 disc space down to near C7 and T1 concerning for epidural abscess.  Patient underwent a 2D echo, TEE negative for endocarditis.  Admission at that time complicated with a thrombosed basilic vein on the left as well as phlebitis on the right with some purulence.  Patient also noted to have severe cirrhosis of the liver presumably from hep C and alcohol abuse.  During that admission patient underwent diagnostic and therapeutic paracentesis x3, placed on Vanco and cefepime. Repeat blood cultures obtained which showed clearance patient narrowed to vancomycin and discharged to SNF.  Tunneled catheter placed by IR and patient was to be discharged on daptomycin versus vancomycin. -At skilled nursing facility patient noted to have worsening renal function and supposedly was changed from IV Vanco IV daptomycin. -Patient noted at skilled nursing facility to have significant severe lower back pain, concern for fevers, EMS was called and patient brought to the  ED.  Patient seen in the ED MRI of the C, T, L-spine done consistent with discitis and osteomyelitis of C6-C7, L1 and L2 with pathologic fracture and extensive paraspinal and epidural inflammatory changes as well as abscess in the right psoas muscle.  No infection noted on the T-spine.  ID consulted patient placed on IV vancomycin initially and changed back to IV daptomycin per ID recommendations.  Patient seen by neurosurgery and feel no surgical intervention needed at this time.  GI consulted to help optimize cirrhosis.  Palliative care consultation pending   Assessment & Plan:   Principal Problem:   Diskitis Active Problems:   IVDU (intravenous drug user)   Chronic hepatitis C (HCC)   MRSA bacteremia   Other cirrhosis of liver (HCC)   Anemia   Ascites of liver   AKI (acute kidney injury) (HCC)   Pancytopenia (HCC)   Cirrhosis of liver with ascites (HCC)   Osteomyelitis of cervical spine (HCC)  #1 C6-C7 discitis and osteomyelitis/new L1-L2 discitis and osteomyelitis with pathologic fracture, paraspinal epidural inflammatory changes and probable abscess of the right psoas muscle. -History as noted above. -Patient seen in consultation by ID who recommended switching back from vancomycin to daptomycin. -Neurosurgery contacted and feel no neurosurgical intervention needed at this time. -ID following and appreciate input and recommendations.  2.  MRSA bacteremia -2D echo, TEE done at outside hospital negative for endocarditis. -Continue IV daptomycin. -ID following.  3.  Cirrhosis with ascites/chronic hepatitis C/hyperbilirubinemia -Likely secondary to chronic hep C and possible history of alcohol use. -Abdomen significantly distended, tight. -Diagnostic and therapeutic ultrasound-guided paracentesis ordered. -IV albumin to be given  post paracentesis. -Placed on lactulose twice daily, Xifaxan, Lasix, spironolactone. -Consulted GI to help with optimization of cirrhosis.  4.  IV drug  use and alcohol abuse -Continue Suboxone. -Changing IV morphine to IV Dilaudid as patient with significant severe back pain from discitis and osteomyelitis as well as significant ascites. -Would not recommend further escalation. -Trying to wean down narcotics as soon as possible.  5.  Pancytopenia -Likely secondary to cirrhosis. -No overt bleeding noted.  6.  Acute kidney injury -Baseline noted at 0.7-0.8. -Creatinine noted on admission 1.58. -Abdominal ultrasound negative for hydronephrosis -Creatinine down to 1.12. -Monitor with diuretics.  7.  Anemia -Patient with no overt bleeding. -Anemia panel consistent with anemia of chronic disease. -Transfused 2 units packed red blood cells. -Follow H&H.     DVT prophylaxis: SCDs Code Status: DNR Family Communication: Updated patient, son, mother at bedside. Disposition:   Status is: Inpatient  Remains inpatient appropriate because: Severity of illness       Consultants:  ID: Dr.Van Dam 07/11/2021 IR Palliative care pending Gastroenterology: Dr.Magod 07/12/2021  Procedures: Chest x-ray 07/11/2021>>>> MRI C/L/T-spine 07/11/2021 Ultrasound-guided paracentesis pending  Antimicrobials:  IV daptomycin 07/12/2021 IV vancomycin 07/11/2021>>>> 07/12/2021   Subjective: Patient laying in bed, complains of lower back pain.  Eating her lunch.  Feels abdomen is very tight and distended today.  Somewhat forgetful.  Objective: Vitals:   07/12/21 1330 07/12/21 1351 07/12/21 1352 07/12/21 1433  BP: 126/72 125/84 125/84 127/77  Pulse: 86 90 90 90  Resp: Temp: 97.9 F (36.6 C) 97.9 F (36.6 C) 97.9 F (36.6 C) 97.9 F (36.6 C)  TempSrc: Oral Oral Oral Oral  SpO2: 98% 99% 99% 98%  Weight:      Height:        Intake/Output Summary (Last 24 hours) at 07/12/2021 1714 Last data filed at 07/12/2021 1600 Gross per 24 hour  Intake 2262.11 ml  Output --  Net 2262.11 ml   Filed Weights   07/11/21 0005   Weight: 52.2 kg    Examination:  General exam: Frail.  Emaciated.  Poor dentition Respiratory system: Clear to auscultation.  Decreased breath sounds in the bases.  Some shallow breaths.  Respiratory effort normal. Cardiovascular system: S1 & S2 heard, RRR. No JVD, murmurs, rubs, gallops or clicks.  1-2+ bilateral lower extremity edema. Gastrointestinal system: Abdomen is distended, significantly tight, some diffuse tenderness to palpation, positive bowel sounds.  Central nervous system: Alert. No focal neurological deficits.  Moving extremities spontaneously. Extremities: Symmetric 5 x 5 power. Skin: No rashes, lesions or ulcers Psychiatry: Judgement and insight appear normal. Mood & affect appropriate.     Data Reviewed: I have personally reviewed following labs and imaging studies  CBC: Recent Labs  Lab 07/11/21 0132 07/12/21 0339  WBC 2.9* 2.3*  NEUTROABS 1.9  --   HGB 7.7* 6.1*  HCT 24.1* 19.0*  MCV 104.8* 103.8*  PLT 120* 112*    Basic Metabolic Panel: Recent Labs  Lab 07/11/21 0132 07/12/21 0339  NA 133* 133*  K 4.3 4.1  CL 106 108  CO2 20* 19*  GLUCOSE 107* 139*  BUN 32* 23*  CREATININE 1.58* 1.12*  CALCIUM 8.3* 7.9*    GFR: Estimated Creatinine Clearance: 41.8 mL/min (A) (by C-G formula based on SCr of 1.12 mg/dL (H)).  Liver Function Tests: Recent Labs  Lab 07/11/21 0132 07/12/21 0339  AST 51* 38  ALT 25 21  ALKPHOS 77 58  BILITOT 2.4* 1.8*  PROT 8.7*  7.3  ALBUMIN 2.4* 2.1*    CBG: No results for input(s): GLUCAP in the last 168 hours.   Recent Results (from the past 240 hour(s))  Blood Culture (routine x 2)     Status: None (Preliminary result)   Collection Time: 07/11/21  1:32 AM   Specimen: BLOOD  Result Value Ref Range Status   Specimen Description   Final    BLOOD BLOOD LEFT FOREARM Performed at [facility], [address], [place], [place] [address]    Special Requests   Final    BOTTLES DRAWN AEROBIC  AND ANAEROBIC Blood Culture results may not be optimal due to an inadequate volume of blood received in culture bottles Performed at [facility], [address] [place], [place] [address]    Culture   Final    NO GROWTH 1 DAY Performed at [facility], [address], [place], [place] [address]    Report Status PENDING  Incomplete  Blood Culture (routine x 2)     Status: None (Preliminary result)   Collection Time: 07/11/21  1:34 AM   Specimen: BLOOD  Result Value Ref Range Status   Specimen Description   Final    BLOOD BLOOD RIGHT FOREARM Performed at [facility], [address], [place], [place] [address]    Special Requests   Final    BOTTLES DRAWN AEROBIC AND ANAEROBIC Blood Culture adequate volume Performed at [facility], [address], [place], [place] [address]    Culture   Final    NO GROWTH 1 DAY Performed at [facility], [address] [place], [place] [address]    Report Status PENDING  Incomplete  Resp Panel by RT-PCR (Flu A&B, Covid) Nasopharyngeal Swab     Status: None   Collection Time: 07/11/21 12:47 PM   Specimen: Nasopharyngeal Swab; Nasopharyngeal(NP) swabs in vial transport medium  Result Value Ref Range Status   SARS Coronavirus 2 by RT PCR NEGATIVE NEGATIVE Final    Comment: (NOTE) SARS-CoV-2 target nucleic acids are NOT DETECTED.  The SARS-CoV-2 RNA is generally detectable in upper respiratory specimens during the acute phase of infection. The lowest concentration of SARS-CoV-2 viral copies this assay can detect is 138 copies/mL. A negative result does not preclude SARS-Cov-2 infection and should not be used as the sole basis for treatment or other patient management decisions. A negative result may occur with  improper specimen collection/handling, submission of specimen other than nasopharyngeal swab, presence of viral mutation(s) within the areas targeted by this assay, and  inadequate number of viral copies(<138 copies/mL). A negative result must be combined with clinical observations, patient history, and epidemiological information. The expected result is Negative.  Fact Sheet for Patients:  BloggerCourse.com  Fact Sheet for Healthcare Providers:  SeriousBroker.it  This test is no t yet approved or cleared by the [place] FDA and  has been authorized for detection and/or diagnosis of SARS-CoV-2 by FDA under an Emergency Use Authorization (EUA). This EUA will remain  in effect (meaning this test can be used) for the duration of the COVID-19 declaration under Section 564(b)(1) of the Act, 21 U.S.C.section 360bbb-3(b)(1), unless the authorization is terminated  or revoked sooner.       Influenza A by PCR NEGATIVE NEGATIVE Final   Influenza B by PCR NEGATIVE NEGATIVE Final    Comment: (NOTE) The Xpert Xpress SARS-CoV-2/FLU/RSV plus assay is intended as an aid in the diagnosis of influenza  from Nasopharyngeal swab specimens and should not be used as a sole basis for treatment. Nasal washings and aspirates are unacceptable for Xpert Xpress SARS-CoV-2/FLU/RSV testing.  Fact Sheet for Patients: BloggerCourse.com  Fact Sheet for Healthcare Providers: SeriousBroker.it  This test is not yet approved or cleared by the [place] FDA and has been authorized for detection and/or diagnosis of SARS-CoV-2 by FDA under an Emergency Use Authorization (EUA). This EUA will remain in effect (meaning this test can be used) for the duration of the COVID-19 declaration under Section 564(b)(1) of the Act, 21 U.S.C. section 360bbb-3(b)(1), unless the authorization is terminated or revoked.  Performed at [facility], [address] [place], [place] [address]          Radiology Studies: MR Cervical Spine W or Wo Contrast  Result  Date: 07/11/2021 CLINICAL DATA:  Mid to low back pain. Reported findings of discitis/osteomyelitis on outside MRI 06/26/2021. history of drug abuse. EXAM: MRI CERVICAL, THORACIC AND LUMBAR SPINE WITHOUT AND WITH CONTRAST TECHNIQUE: Multiplanar and multiecho pulse sequences of the cervical spine, to include the craniocervical junction and cervicothoracic junction, and thoracic and lumbar spine, were obtained without and with intravenous contrast. CONTRAST:  84mL GADAVIST GADOBUTROL 1 MMOL/ML IV SOLN COMPARISON:  Portable chest radiograph same date. No previous outside imaging or reports available. FINDINGS: MRI CERVICAL SPINE FINDINGS Alignment: Straightening without focal angulation or significant listhesis. Vertebrae: There is mild T2 hyperintensity within the C6-7 disc. There is adjacent endplate T2 hyperintensity, T1 hypointensity and diffuse marrow enhancement within the C6 and C7 vertebral bodies consistent with osteomyelitis. No significant pathologic fracture. No other significant marrow signal abnormalities in the cervical spine. Cord: Normal in signal and caliber. No abnormal intradural enhancement. Posterior Fossa, vertebral arteries, paraspinal tissues: The visualized craniocervical junction appears normal. There are bilateral vertebral artery flow voids. There are anterior paraspinal inflammatory changes within the prevertebral soft tissues at C6-7 without focal fluid collection. No epidural fluid collections are seen. Disc levels: Multilevel cervical spondylosis with disc bulging, uncinate spurring and facet hypertrophy. There is no cord compression, although multilevel mild-to-moderate foraminal narrowing is present from C3-4 through C6-7. As above, findings of discitis and early osteomyelitis at C6-7 without associated epidural fluid collection. There are anterior paraspinal inflammatory changes with heterogeneous enhancement, but no focal fluid collection. MRI THORACIC SPINE FINDINGS Alignment:   Normal. Vertebrae: No evidence of acute fracture, discitis or osteomyelitis within the thoracic spine. Cord: Normal in signal and caliber. No abnormal intradural enhancement. Paraspinal and other soft tissues: The thoracic paraspinal soft tissues appear unremarkable. Trace bilateral pleural effusions. Patchy pulmonary opacities are noted, suboptimally evaluated by MRI, but likely reflecting atelectasis or edema based on earlier chest radiographs. Disc levels: The thoracic discs appear normal. No disc herniation, spinal stenosis or nerve root encroachment identified in the thoracic spine. MRI LUMBAR SPINE FINDINGS Segmentation:  There are 5 lumbar type vertebral bodies. Alignment: Trace retrolisthesis at L1-2 and anterolisthesis at L5-S1. Vertebrae: There are changes of advanced discitis and osteomyelitis at L1-2. There is endplate destruction, loss of vertebral body height and diffuse marrow enhancement throughout the L1 and L2 vertebral bodies. The intervening disc demonstrates heterogeneous T2 hyperintensity and enhancement. No additional levels of discitis/osteomyelitis identified. No definite infected facet joints are seen. Conus medullaris and cauda equina: Conus extends to the L1-2 level. There is mass effect on the thecal sac by anterior epidural inflammation at L1-2, but no gross conus compression or abnormal intradural enhancement. Paraspinal and other soft tissues: Extensive  paraspinal inflammatory changes at L1-2 with a small peripherally enhancing fluid collection within the right psoas muscle adjacent to the L1 vertebral body, measuring approximately 2.2 x 0.8 x 1.5 cm. No other focal paraspinal fluid collections are identified. There is anterior epidural inflammation at L1-2 extending into both foramina. No epidural fluid collection identified. The spleen is incompletely imaged, although appears moderately enlarged. There is generalized soft tissue edema with evidence of at least moderate ascites. Disc  levels: T12-L1: Normal interspace. L1-2: Advanced changes of diskitis and osteomyelitis with annular disc bulging and extensive anterior epidural inflammation extending into both foramina. There is moderate mass effect on the thecal sac with moderate foraminal narrowing bilaterally, worse on the left. L2-3: Normal interspace. L3-4: Normal interspace. L4-5: Disc height and hydration are maintained. Mild facet and ligamentous hypertrophy. No significant spinal stenosis. L5-S1: Mild disc bulging with endplate osteophytes asymmetric to the right. Mild bilateral facet hypertrophy. Mild right foraminal narrowing. IMPRESSION: 1. Findings consistent with discitis and osteomyelitis at C6-7. No associated cervical paraspinal or epidural abscess or mass effect on the cord. 2. 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 There is a small probable abscess within the right psoas muscle and mass effect on the thecal sac and both neural foramina. No cord compression or drainable epidural fluid collections identified. 3. No significant thoracic spine findings. 4. Multilevel cervical spondylosis as described with foraminal narrowing bilaterally from C3-4 through C6-7 due to spondylosis. 5. Anasarca with generalized soft tissue edema, ascites and small bilateral pleural effusions. Electronically Signed   By: Carey Bullocks M.D.   On: 07/11/2021 10:43   MR THORACIC SPINE W WO CONTRAST  Result Date: 07/11/2021 CLINICAL DATA:  Mid to low back pain. Reported findings of discitis/osteomyelitis on outside MRI 06/26/2021. history of drug abuse. EXAM: MRI CERVICAL, THORACIC AND LUMBAR SPINE WITHOUT AND WITH CONTRAST TECHNIQUE: Multiplanar and multiecho pulse sequences of the cervical spine, to include the craniocervical junction and cervicothoracic junction, and thoracic and lumbar spine, were obtained without and with intravenous contrast.  CONTRAST:  5mL GADAVIST GADOBUTROL 1 MMOL/ML IV SOLN COMPARISON:  Portable chest radiograph same date. No previous outside imaging or reports available. FINDINGS: MRI CERVICAL SPINE FINDINGS Alignment: Straightening without focal angulation or significant listhesis. Vertebrae: There is mild T2 hyperintensity within the C6-7 disc. There is adjacent endplate T2 hyperintensity, T1 hypointensity and diffuse marrow enhancement within the C6 and C7 vertebral bodies consistent with osteomyelitis. No significant pathologic fracture. No other significant marrow signal abnormalities in the cervical spine. Cord: Normal in signal and caliber. No abnormal intradural enhancement. Posterior Fossa, vertebral arteries, paraspinal tissues: The visualized craniocervical junction appears normal. There are bilateral vertebral artery flow voids. There are anterior paraspinal inflammatory changes within the prevertebral soft tissues at C6-7 without focal fluid collection. No epidural fluid collections are seen. Disc levels: Multilevel cervical spondylosis with disc bulging, uncinate spurring and facet hypertrophy. There is no cord compression, although multilevel mild-to-moderate foraminal narrowing is present from C3-4 through C6-7. As above, findings of discitis and early osteomyelitis at C6-7 without associated epidural fluid collection. There are anterior paraspinal inflammatory changes with heterogeneous enhancement, but no focal fluid collection. MRI THORACIC SPINE FINDINGS Alignment:  Normal. Vertebrae: No evidence of acute fracture, discitis or osteomyelitis within the thoracic spine. Cord: Normal in signal and caliber. No abnormal intradural enhancement. Paraspinal and other soft tissues: The thoracic paraspinal soft tissues appear unremarkable. Trace bilateral pleural effusions. Patchy pulmonary opacities are  noted, suboptimally evaluated by MRI, but likely reflecting atelectasis or edema based on earlier chest radiographs. Disc  levels: The thoracic discs appear normal. No disc herniation, spinal stenosis or nerve root encroachment identified in the thoracic spine. MRI LUMBAR SPINE FINDINGS Segmentation:  There are 5 lumbar type vertebral bodies. Alignment: Trace retrolisthesis at L1-2 and anterolisthesis at L5-S1. Vertebrae: There are changes of advanced discitis and osteomyelitis at L1-2. There is endplate destruction, loss of vertebral body height and diffuse marrow enhancement throughout the L1 and L2 vertebral bodies. The intervening disc demonstrates heterogeneous T2 hyperintensity and enhancement. No additional levels of discitis/osteomyelitis identified. No definite infected facet joints are seen. Conus medullaris and cauda equina: Conus extends to the L1-2 level. There is mass effect on the thecal sac by anterior epidural inflammation at L1-2, but no gross conus compression or abnormal intradural enhancement. Paraspinal and other soft tissues: Extensive paraspinal inflammatory changes at L1-2 with a small peripherally enhancing fluid collection within the right psoas muscle adjacent to the L1 vertebral body, measuring approximately 2.2 x 0.8 x 1.5 cm. No other focal paraspinal fluid collections are identified. There is anterior epidural inflammation at L1-2 extending into both foramina. No epidural fluid collection identified. The spleen is incompletely imaged, although appears moderately enlarged. There is generalized soft tissue edema with evidence of at least moderate ascites. Disc levels: T12-L1: Normal interspace. L1-2: Advanced changes of diskitis and osteomyelitis with annular disc bulging and extensive anterior epidural inflammation extending into both foramina. There is moderate mass effect on the thecal sac with moderate foraminal narrowing bilaterally, worse on the left. L2-3: Normal interspace. L3-4: Normal interspace. L4-5: Disc height and hydration are maintained. Mild facet and ligamentous hypertrophy. No significant  spinal stenosis. L5-S1: Mild disc bulging with endplate osteophytes asymmetric to the right. Mild bilateral facet hypertrophy. Mild right foraminal narrowing. IMPRESSION: 1. Findings consistent with discitis and osteomyelitis at C6-7. No associated cervical paraspinal or epidural abscess or mass effect on the cord. 2. 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 There is a small probable abscess within the right psoas muscle and mass effect on the thecal sac and both neural foramina. No cord compression or drainable epidural fluid collections identified. 3. No significant thoracic spine findings. 4. Multilevel cervical spondylosis as described with foraminal narrowing bilaterally from C3-4 through C6-7 due to spondylosis. 5. Anasarca with generalized soft tissue edema, ascites and small bilateral pleural effusions. Electronically Signed   By: Carey Bullocks M.D.   On: 07/11/2021 10:43   MR Lumbar Spine W Wo Contrast  Result Date: 07/11/2021 CLINICAL DATA:  Mid to low back pain. Reported findings of discitis/osteomyelitis on outside MRI 06/26/2021. history of drug abuse. EXAM: MRI CERVICAL, THORACIC AND LUMBAR SPINE WITHOUT AND WITH CONTRAST TECHNIQUE: Multiplanar and multiecho pulse sequences of the cervical spine, to include the craniocervical junction and cervicothoracic junction, and thoracic and lumbar spine, were obtained without and with intravenous contrast. CONTRAST:  41mL GADAVIST GADOBUTROL 1 MMOL/ML IV SOLN COMPARISON:  Portable chest radiograph same date. No previous outside imaging or reports available. FINDINGS: MRI CERVICAL SPINE FINDINGS Alignment: Straightening without focal angulation or significant listhesis. Vertebrae: There is mild T2 hyperintensity within the C6-7 disc. There is adjacent endplate T2 hyperintensity, T1 hypointensity and diffuse marrow enhancement within the C6 and C7 vertebral  bodies consistent with osteomyelitis. No significant pathologic fracture. No other significant marrow signal abnormalities in the cervical spine. Cord: Normal in signal and caliber. No  abnormal intradural enhancement. Posterior Fossa, vertebral arteries, paraspinal tissues: The visualized craniocervical junction appears normal. There are bilateral vertebral artery flow voids. There are anterior paraspinal inflammatory changes within the prevertebral soft tissues at C6-7 without focal fluid collection. No epidural fluid collections are seen. Disc levels: Multilevel cervical spondylosis with disc bulging, uncinate spurring and facet hypertrophy. There is no cord compression, although multilevel mild-to-moderate foraminal narrowing is present from C3-4 through C6-7. As above, findings of discitis and early osteomyelitis at C6-7 without associated epidural fluid collection. There are anterior paraspinal inflammatory changes with heterogeneous enhancement, but no focal fluid collection. MRI THORACIC SPINE FINDINGS Alignment:  Normal. Vertebrae: No evidence of acute fracture, discitis or osteomyelitis within the thoracic spine. Cord: Normal in signal and caliber. No abnormal intradural enhancement. Paraspinal and other soft tissues: The thoracic paraspinal soft tissues appear unremarkable. Trace bilateral pleural effusions. Patchy pulmonary opacities are noted, suboptimally evaluated by MRI, but likely reflecting atelectasis or edema based on earlier chest radiographs. Disc levels: The thoracic discs appear normal. No disc herniation, spinal stenosis or nerve root encroachment identified in the thoracic spine. MRI LUMBAR SPINE FINDINGS Segmentation:  There are 5 lumbar type vertebral bodies. Alignment: Trace retrolisthesis at L1-2 and anterolisthesis at L5-S1. Vertebrae: There are changes of advanced discitis and osteomyelitis at L1-2. There is endplate destruction, loss of vertebral body height and diffuse marrow  enhancement throughout the L1 and L2 vertebral bodies. The intervening disc demonstrates heterogeneous T2 hyperintensity and enhancement. No additional levels of discitis/osteomyelitis identified. No definite infected facet joints are seen. Conus medullaris and cauda equina: Conus extends to the L1-2 level. There is mass effect on the thecal sac by anterior epidural inflammation at L1-2, but no gross conus compression or abnormal intradural enhancement. Paraspinal and other soft tissues: Extensive paraspinal inflammatory changes at L1-2 with a small peripherally enhancing fluid collection within the right psoas muscle adjacent to the L1 vertebral body, measuring approximately 2.2 x 0.8 x 1.5 cm. No other focal paraspinal fluid collections are identified. There is anterior epidural inflammation at L1-2 extending into both foramina. No epidural fluid collection identified. The spleen is incompletely imaged, although appears moderately enlarged. There is generalized soft tissue edema with evidence of at least moderate ascites. Disc levels: T12-L1: Normal interspace. L1-2: Advanced changes of diskitis and osteomyelitis with annular disc bulging and extensive anterior epidural inflammation extending into both foramina. There is moderate mass effect on the thecal sac with moderate foraminal narrowing bilaterally, worse on the left. L2-3: Normal interspace. L3-4: Normal interspace. L4-5: Disc height and hydration are maintained. Mild facet and ligamentous hypertrophy. No significant spinal stenosis. L5-S1: Mild disc bulging with endplate osteophytes asymmetric to the right. Mild bilateral facet hypertrophy. Mild right foraminal narrowing. IMPRESSION: 1. Findings consistent with discitis and osteomyelitis at C6-7. No associated cervical paraspinal or epidural abscess or mass effect on the cord. 2. 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 There is a small probable abscess within the right psoas muscle and mass effect on the thecal sac and both neural foramina. No cord compression or drainable epidural fluid collections identified. 3. No significant thoracic spine findings. 4. Multilevel cervical spondylosis as described with foraminal narrowing bilaterally from C3-4 through C6-7 due to spondylosis. 5. Anasarca with generalized soft tissue edema, ascites and small bilateral pleural effusions. Electronically Signed   By: Carey Bullocks M.D.   On: 07/11/2021 10:43   US Abdomen Complete  Result Date: 07/11/2021 CLINICAL  DATA:  Liver failure.  Cirrhosis. EXAM: ABDOMEN ULTRASOUND COMPLETE COMPARISON:  None. FINDINGS: Gallbladder: No gallstones or wall thickening visualized. No sonographic Murphy sign noted by sonographer. Common bile duct: Diameter: 5 mm Liver: Cirrhosis. Portal vein is patent on color Doppler imaging with normal direction of blood flow towards the liver. IVC: No abnormality visualized. Pancreas: Visualized portion unremarkable. Spleen: Splenomegaly measuring 19 cm in length. Right Kidney: Length: 10 cm. Echogenicity within normal limits. No mass or hydronephrosis visualized. Left Kidney: Length: 10 cm. Echogenicity within normal limits. No mass or hydronephrosis visualized. Abdominal aorta: No aneurysm visualized. Other findings: There is a small ascites. IMPRESSION: 1. Cirrhosis with evidence of portal hypertension, splenomegaly, and ascites. 2. Patent main portal vein with hepatopetal flow. Electronically Signed   By: Elgie Collard M.D.   On: 07/11/2021 22:23   DG Chest Port 1 View  Result Date: 07/11/2021 CLINICAL DATA:  Chronic lower back pain. EXAM: PORTABLE CHEST 1 VIEW COMPARISON:  None. FINDINGS: A right-sided venous catheter is seen with its distal tip noted at the junction of the superior vena cava and right atrium. Mild linear atelectasis is present within the bilateral lung bases. There is  no evidence of a pleural effusion or pneumothorax. The heart size and mediastinal contours are within normal limits. The visualized skeletal structures are unremarkable. IMPRESSION: 1. Right-sided venous catheter positioning, as described above. 2. Mild bibasilar linear atelectasis. Electronically Signed   By: Aram Candela M.D.   On: 07/11/2021 00:36   [place] EKG SITE RITE  Result Date: 07/12/2021 If Site Rite image not attached, placement could not be confirmed due to current cardiac rhythm.       Scheduled Meds:  buprenorphine-naloxone  2 tablet Sublingual BID   Chlorhexidine Gluconate Cloth  6 each Topical Daily   furosemide  40 mg Oral Daily   gabapentin  200 mg Oral TID   lactulose  20 g Oral BID   methocarbamol  500 mg Oral TID   multivitamin with minerals  1 tablet Oral Daily   rifaximin  550 mg Oral BID   sodium chloride flush  10-40 mL Intracatheter Q12H   spironolactone  100 mg Oral Daily   Continuous Infusions:  albumin human     DAPTOmycin (CUBICIN)  IV       LOS: 1 day    Time spent: 45 minutes    Ramiro Harvest, MD [facility]   To contact the attending provider between 7A-7P or the covering provider during after hours 7P-7A, please log into the web site [email or web address] and access using universal [facility] password for that web site. If you do not have the password, please call the hospital operator.  07/12/2021, 5:14 PM

## 2021-07-12 NOTE — Progress Notes (Signed)
HgB of 6.1 paged to on- call NP.

## 2021-07-12 NOTE — Progress Notes (Signed)
PHARMACY NOTE -  Daptomycin  Pharmacy has been assisting with dosing of daptomycin for osteomylitis. Dosage remains stable at 500 mg IV daily and further renal adjustments per institutional Pharmacy antibiotic protocol  Pharmacy will sign off, following peripherally for culture results or dose adjustments. Please reconsult if a change in clinical status warrants re-evaluation of dosage.  Bernadene Person, PharmD, BCPS [phone] 07/12/2021, 9:00 AM

## 2021-07-12 NOTE — Consult Note (Signed)
Referring Provider: [facility] Primary Care Physician:  Wenda Low, MD Primary Gastroenterologist:  Althia Forts  Reason for Consultation:  Cirrhosis  HPI: Christina Wyatt is a 55 y.o. female medical history significant of Hepatitis C, bipolar d/o; polysubstance abuse presents for evaluation of cirrhosis.  Patient originally presented to ED with back pain. She was recently discharged from [facility] 11/05 with sepsis from MRSA bacteremia and cervical spine osteomyelitis.  Patient states she follows with [facility] regarding her cirrhosis. Has history of ascites with multiple paracentesis' 11/05, 11/07/ 11/10, and most recent 11/16 with 2 L removed. Cytology is not available. US abdomen showed cirrhosis, splenomegaly (noted since 2013), and portal hypertension. Patient states she came here for back pain. Often has hematochezia due to long standing history of hemorrhoids. She was recently referred to surgery for hemorrhoidectomy. Has 1-2 Bms/day. Denies abdominal pain. Denies nausea, vomiting. Is currently on suboxone for polysubstance use. History of alcohol use.  Colonoscopy 06/2017: cecal benign polyp, ileal cecal valve polyp with bx consistent with submucosal lipoma, ascending tubular adenoma, and rectal hyperplastic polyp    Past Medical History:  Diagnosis Date   Chronic viral hepatitis C ([place])    IVDU (intravenous drug user) 07/11/2021   Liver cirrhosis ([place])    MRSA bacteremia 07/11/2021   MRSA infection (methicillin-resistant Staphylococcus aureus)    Opioid dependence, uncomplicated (HCC)    Osteomyelitis of vertebra of cervical region [facility])    Unspecified severe protein-calorie malnutrition ([place])     History reviewed. No pertinent surgical history.  Prior to Admission medications   Medication Sig Start Date End Date Taking? Authorizing Provider  bisacodyl (DULCOLAX) 5 MG EC tablet Take 5 mg by mouth daily as needed for moderate constipation.   Yes [provider]   buprenorphine-naloxone (SUBOXONE) 2-0.5 mg SUBL SL tablet Place 2 tablets under the tongue 2 (two) times daily.   Yes [provider]  celecoxib (CELEBREX) 200 MG capsule Take 200 mg by mouth 2 (two) times daily.   Yes [provider]  DAPTOmycin (CUBICIN) 500 MG injection Inject 375 mg into the vein daily.   Yes [provider]  diclofenac Sodium (VOLTAREN) 1 % GEL Apply 4 g topically 2 (two) times daily.   Yes [provider]  furosemide (LASIX) 20 MG tablet Take 60 mg by mouth daily.   Yes [provider]  gabapentin (NEURONTIN) 400 MG capsule Take 400 mg by mouth 3 (three) times daily.   Yes [provider]  Infant Care Products [facility]) OINT Apply 1 application topically 2 (two) times daily.   Yes [provider]  Lidocaine (HM LIDOCAINE PATCH) 4 % PTCH Apply 2 patches topically daily.   Yes [provider]  melatonin 3 MG TABS tablet Take 3 mg by mouth at bedtime as needed (for sleep).   Yes [provider]  methocarbamol (ROBAXIN) 500 MG tablet Take 500 mg by mouth 4 (four) times daily.   Yes [provider]  Multiple Vitamins-Minerals (MULTIVITAMIN WITH MINERALS) tablet Take 1 tablet by mouth daily.   Yes [provider]  polyethylene glycol powder (GLYCOLAX/MIRALAX) 17 GM/SCOOP powder Take 1 Container by mouth See admin instructions. Mix 17g (1 scoop) with 6 to 8 ounces of beverage and drink daily   Yes [provider]  senna-docusate (SENOKOT-S) 8.6-50 MG tablet Take 1 tablet by mouth 2 (two) times daily.   Yes [provider]  spironolactone (ALDACTONE) 100 MG tablet Take 200 mg by mouth daily.   Yes [provider]  naloxone (NARCAN) nasal spray 4 mg/0.1 mL Place 1 spray into the nose once. Patient not taking: Reported on 07/11/2021    [provider]    Scheduled Meds:  sodium chloride   Intravenous Once   sodium chloride   Intravenous Once    acetaminophen  650 mg Oral Once   Chlorhexidine Gluconate Cloth  6 each Topical Daily   diphenhydrAMINE  25 mg Oral Once   furosemide  40 mg Oral Daily   gabapentin  200 mg Oral TID   lactulose  20 g Oral BID   methocarbamol  500 mg Oral TID   multivitamin with minerals  1 tablet Oral Daily   rifaximin  550 mg Oral BID   sodium chloride flush  10-40 mL Intracatheter Q12H   spironolactone  100 mg Oral Daily   Continuous Infusions:  albumin human     DAPTOmycin (CUBICIN)  IV     PRN Meds:.melatonin, morphine injection, ondansetron **OR** ondansetron (ZOFRAN) IV, oxyCODONE, sodium chloride flush  Allergies as of 07/10/2021   (Not on File)    History reviewed. No pertinent family history.  Social History   Socioeconomic History   Marital status: Legally Separated    Spouse name: Not on file   Number of children: Not on file   Years of education: Not on file   Highest education level: Not on file  Occupational History   Not on file  Tobacco Use   Smoking status: Former    Packs/day: 1.00    Types: Cigarettes   Smokeless tobacco: Never  Vaping Use   Vaping Use: Never used  Substance and Sexual Activity   Alcohol use: Not Currently   Drug use: Not Currently   Sexual activity: Not on file  Other Topics Concern   Not on file  Social History Narrative   Not on file   Social Determinants of Health   Financial Resource Strain: Not on file  Food Insecurity: Not on file  Transportation Needs: Not on file  Physical Activity: Not on file  Stress: Not on file  Social Connections: Not on file  Intimate Partner Violence: Not on file    Review of Systems: Review of Systems  Constitutional:  Negative for chills and fever.  HENT:  Negative for sore throat.   Eyes:  Negative for pain and redness.  Respiratory:  Negative for cough, hemoptysis and stridor.   Cardiovascular:  Negative for chest pain and palpitations.  Gastrointestinal:  Positive for blood in stool. Negative  for abdominal pain, constipation, diarrhea, heartburn, melena, nausea and vomiting.  Genitourinary:  Negative for dysuria and urgency.  Musculoskeletal:  Negative for myalgias and neck pain.  Skin:  Negative for itching and rash.  Neurological:  Negative for seizures and loss of consciousness.  Psychiatric/Behavioral:  Positive for substance abuse. The patient is nervous/anxious.     Physical Exam:Physical Exam Constitutional:      General: She is not in acute distress.    Appearance: Normal appearance. She is normal weight.  HENT:     Head: Normocephalic and atraumatic.     Nose: Nose normal. No congestion.     Mouth/Throat:     Mouth: Mucous membranes are moist.     Pharynx: Oropharynx is clear.  Eyes:     General: No scleral icterus.    Extraocular Movements: Extraocular movements intact.     Conjunctiva/sclera: Conjunctivae normal.  Cardiovascular:     Rate and Rhythm: Normal rate and regular rhythm.  Pulmonary:  Effort: Pulmonary effort is normal. No respiratory distress.  Abdominal:     General: Bowel sounds are normal. There is distension.     Palpations: There is no mass.     Tenderness: There is no abdominal tenderness. There is no guarding or rebound.     Hernia: No hernia is present.  Musculoskeletal:        General: No swelling. Normal range of motion.     Cervical back: Normal range of motion and neck supple.  Skin:    General: Skin is warm and dry.     Coloration: Skin is not jaundiced.  Neurological:     Mental Status: She is oriented to person, place, and time.     Cranial Nerves: No cranial nerve deficit.  Psychiatric:        Mood and Affect: Mood normal.        Behavior: Behavior normal.        Thought Content: Thought content normal.        Judgment: Judgment normal.    Vital signs: Vitals:   07/12/21 0220 07/12/21 0521  BP: 120/74 (!) 169/90  Pulse: 96 100  Resp: 20 16  Temp: 97.7 F (36.5 C) (!) 97.5 F (36.4 C)  SpO2: 100% 100%   Last  BM Date:  (PTA 07/10/2021)    GI:  Lab Results: Recent Labs    07/11/21 0132 07/12/21 0339  WBC 2.9* 2.3*  HGB 7.7* 6.1*  HCT 24.1* 19.0*  PLT 120* 112*   BMET Recent Labs    07/11/21 0132 07/12/21 0339  NA 133* 133*  K 4.3 4.1  CL 106 108  CO2 20* 19*  GLUCOSE 107* 139*  BUN 32* 23*  CREATININE 1.58* 1.12*  CALCIUM 8.3* 7.9*   LFT Recent Labs    07/12/21 0339  PROT 7.3  ALBUMIN 2.1*  AST 38  ALT 21  ALKPHOS 58  BILITOT 1.8*   PT/INR Recent Labs    07/11/21 0132 07/12/21 0339  LABPROT 17.5* 18.5*  INR 1.4* 1.5*     Studies/Results: MR Cervical Spine W or Wo Contrast  Result Date: 07/11/2021 CLINICAL DATA:  Mid to low back pain. Reported findings of discitis/osteomyelitis on outside MRI 06/26/2021. history of drug abuse. EXAM: MRI CERVICAL, THORACIC AND LUMBAR SPINE WITHOUT AND WITH CONTRAST TECHNIQUE: Multiplanar and multiecho pulse sequences of the cervical spine, to include the craniocervical junction and cervicothoracic junction, and thoracic and lumbar spine, were obtained without and with intravenous contrast. CONTRAST:  33mL GADAVIST GADOBUTROL 1 MMOL/ML IV SOLN COMPARISON:  Portable chest radiograph same date. No previous outside imaging or reports available. FINDINGS: MRI CERVICAL SPINE FINDINGS Alignment: Straightening without focal angulation or significant listhesis. Vertebrae: There is mild T2 hyperintensity within the C6-7 disc. There is adjacent endplate T2 hyperintensity, T1 hypointensity and diffuse marrow enhancement within the C6 and C7 vertebral bodies consistent with osteomyelitis. No significant pathologic fracture. No other significant marrow signal abnormalities in the cervical spine. Cord: Normal in signal and caliber. No abnormal intradural enhancement. Posterior Fossa, vertebral arteries, paraspinal tissues: The visualized craniocervical junction appears normal. There are bilateral vertebral artery flow voids. There are anterior  paraspinal inflammatory changes within the prevertebral soft tissues at C6-7 without focal fluid collection. No epidural fluid collections are seen. Disc levels: Multilevel cervical spondylosis with disc bulging, uncinate spurring and facet hypertrophy. There is no cord compression, although multilevel mild-to-moderate foraminal narrowing is present from C3-4 through C6-7. As above, findings of discitis and early osteomyelitis at  C6-7 without associated epidural fluid collection. There are anterior paraspinal inflammatory changes with heterogeneous enhancement, but no focal fluid collection. MRI THORACIC SPINE FINDINGS Alignment:  Normal. Vertebrae: No evidence of acute fracture, discitis or osteomyelitis within the thoracic spine. Cord: Normal in signal and caliber. No abnormal intradural enhancement. Paraspinal and other soft tissues: The thoracic paraspinal soft tissues appear unremarkable. Trace bilateral pleural effusions. Patchy pulmonary opacities are noted, suboptimally evaluated by MRI, but likely reflecting atelectasis or edema based on earlier chest radiographs. Disc levels: The thoracic discs appear normal. No disc herniation, spinal stenosis or nerve root encroachment identified in the thoracic spine. MRI LUMBAR SPINE FINDINGS Segmentation:  There are 5 lumbar type vertebral bodies. Alignment: Trace retrolisthesis at L1-2 and anterolisthesis at L5-S1. Vertebrae: There are changes of advanced discitis and osteomyelitis at L1-2. There is endplate destruction, loss of vertebral body height and diffuse marrow enhancement throughout the L1 and L2 vertebral bodies. The intervening disc demonstrates heterogeneous T2 hyperintensity and enhancement. No additional levels of discitis/osteomyelitis identified. No definite infected facet joints are seen. Conus medullaris and cauda equina: Conus extends to the L1-2 level. There is mass effect on the thecal sac by anterior epidural inflammation at L1-2, but no gross  conus compression or abnormal intradural enhancement. Paraspinal and other soft tissues: Extensive paraspinal inflammatory changes at L1-2 with a small peripherally enhancing fluid collection within the right psoas muscle adjacent to the L1 vertebral body, measuring approximately 2.2 x 0.8 x 1.5 cm. No other focal paraspinal fluid collections are identified. There is anterior epidural inflammation at L1-2 extending into both foramina. No epidural fluid collection identified. The spleen is incompletely imaged, although appears moderately enlarged. There is generalized soft tissue edema with evidence of at least moderate ascites. Disc levels: T12-L1: Normal interspace. L1-2: Advanced changes of diskitis and osteomyelitis with annular disc bulging and extensive anterior epidural inflammation extending into both foramina. There is moderate mass effect on the thecal sac with moderate foraminal narrowing bilaterally, worse on the left. L2-3: Normal interspace. L3-4: Normal interspace. L4-5: Disc height and hydration are maintained. Mild facet and ligamentous hypertrophy. No significant spinal stenosis. L5-S1: Mild disc bulging with endplate osteophytes asymmetric to the right. Mild bilateral facet hypertrophy. Mild right foraminal narrowing. IMPRESSION: 1. Findings consistent with discitis and osteomyelitis at C6-7. No associated cervical paraspinal or epidural abscess or mass effect on the cord. 2. 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 There is a small probable abscess within the right psoas muscle and mass effect on the thecal sac and both neural foramina. No cord compression or drainable epidural fluid collections identified. 3. No significant thoracic spine findings. 4. Multilevel cervical spondylosis as described with foraminal narrowing bilaterally from C3-4 through C6-7 due to spondylosis. 5. Anasarca with  generalized soft tissue edema, ascites and small bilateral pleural effusions. Electronically Signed   By: Richardean Sale M.D.   On: 07/11/2021 10:43   MR THORACIC SPINE W WO CONTRAST  Result Date: 07/11/2021 CLINICAL DATA:  Mid to low back pain. Reported findings of discitis/osteomyelitis on outside MRI 06/26/2021. history of drug abuse. EXAM: MRI CERVICAL, THORACIC AND LUMBAR SPINE WITHOUT AND WITH CONTRAST TECHNIQUE: Multiplanar and multiecho pulse sequences of the cervical spine, to include the craniocervical junction and cervicothoracic junction, and thoracic and lumbar spine, were obtained without and with intravenous contrast. CONTRAST:  27mL GADAVIST GADOBUTROL 1 MMOL/ML IV SOLN COMPARISON:  Portable chest radiograph same date. No previous outside imaging  or reports available. FINDINGS: MRI CERVICAL SPINE FINDINGS Alignment: Straightening without focal angulation or significant listhesis. Vertebrae: There is mild T2 hyperintensity within the C6-7 disc. There is adjacent endplate T2 hyperintensity, T1 hypointensity and diffuse marrow enhancement within the C6 and C7 vertebral bodies consistent with osteomyelitis. No significant pathologic fracture. No other significant marrow signal abnormalities in the cervical spine. Cord: Normal in signal and caliber. No abnormal intradural enhancement. Posterior Fossa, vertebral arteries, paraspinal tissues: The visualized craniocervical junction appears normal. There are bilateral vertebral artery flow voids. There are anterior paraspinal inflammatory changes within the prevertebral soft tissues at C6-7 without focal fluid collection. No epidural fluid collections are seen. Disc levels: Multilevel cervical spondylosis with disc bulging, uncinate spurring and facet hypertrophy. There is no cord compression, although multilevel mild-to-moderate foraminal narrowing is present from C3-4 through C6-7. As above, findings of discitis and early osteomyelitis at C6-7 without  associated epidural fluid collection. There are anterior paraspinal inflammatory changes with heterogeneous enhancement, but no focal fluid collection. MRI THORACIC SPINE FINDINGS Alignment:  Normal. Vertebrae: No evidence of acute fracture, discitis or osteomyelitis within the thoracic spine. Cord: Normal in signal and caliber. No abnormal intradural enhancement. Paraspinal and other soft tissues: The thoracic paraspinal soft tissues appear unremarkable. Trace bilateral pleural effusions. Patchy pulmonary opacities are noted, suboptimally evaluated by MRI, but likely reflecting atelectasis or edema based on earlier chest radiographs. Disc levels: The thoracic discs appear normal. No disc herniation, spinal stenosis or nerve root encroachment identified in the thoracic spine. MRI LUMBAR SPINE FINDINGS Segmentation:  There are 5 lumbar type vertebral bodies. Alignment: Trace retrolisthesis at L1-2 and anterolisthesis at L5-S1. Vertebrae: There are changes of advanced discitis and osteomyelitis at L1-2. There is endplate destruction, loss of vertebral body height and diffuse marrow enhancement throughout the L1 and L2 vertebral bodies. The intervening disc demonstrates heterogeneous T2 hyperintensity and enhancement. No additional levels of discitis/osteomyelitis identified. No definite infected facet joints are seen. Conus medullaris and cauda equina: Conus extends to the L1-2 level. There is mass effect on the thecal sac by anterior epidural inflammation at L1-2, but no gross conus compression or abnormal intradural enhancement. Paraspinal and other soft tissues: Extensive paraspinal inflammatory changes at L1-2 with a small peripherally enhancing fluid collection within the right psoas muscle adjacent to the L1 vertebral body, measuring approximately 2.2 x 0.8 x 1.5 cm. No other focal paraspinal fluid collections are identified. There is anterior epidural inflammation at L1-2 extending into both foramina. No  epidural fluid collection identified. The spleen is incompletely imaged, although appears moderately enlarged. There is generalized soft tissue edema with evidence of at least moderate ascites. Disc levels: T12-L1: Normal interspace. L1-2: Advanced changes of diskitis and osteomyelitis with annular disc bulging and extensive anterior epidural inflammation extending into both foramina. There is moderate mass effect on the thecal sac with moderate foraminal narrowing bilaterally, worse on the left. L2-3: Normal interspace. L3-4: Normal interspace. L4-5: Disc height and hydration are maintained. Mild facet and ligamentous hypertrophy. No significant spinal stenosis. L5-S1: Mild disc bulging with endplate osteophytes asymmetric to the right. Mild bilateral facet hypertrophy. Mild right foraminal narrowing. IMPRESSION: 1. Findings consistent with discitis and osteomyelitis at C6-7. No associated cervical paraspinal or epidural abscess or mass effect on the cord. 2. 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 There is a small probable abscess within the right psoas muscle and mass effect on the thecal sac and both neural foramina.  No cord compression or drainable epidural fluid collections identified. 3. No significant thoracic spine findings. 4. Multilevel cervical spondylosis as described with foraminal narrowing bilaterally from C3-4 through C6-7 due to spondylosis. 5. Anasarca with generalized soft tissue edema, ascites and small bilateral pleural effusions. Electronically Signed   By: Richardean Sale M.D.   On: 07/11/2021 10:43   MR Lumbar Spine W Wo Contrast  Result Date: 07/11/2021 CLINICAL DATA:  Mid to low back pain. Reported findings of discitis/osteomyelitis on outside MRI 06/26/2021. history of drug abuse. EXAM: MRI CERVICAL, THORACIC AND LUMBAR SPINE WITHOUT AND WITH CONTRAST TECHNIQUE: Multiplanar and  multiecho pulse sequences of the cervical spine, to include the craniocervical junction and cervicothoracic junction, and thoracic and lumbar spine, were obtained without and with intravenous contrast. CONTRAST:  69mL GADAVIST GADOBUTROL 1 MMOL/ML IV SOLN COMPARISON:  Portable chest radiograph same date. No previous outside imaging or reports available. FINDINGS: MRI CERVICAL SPINE FINDINGS Alignment: Straightening without focal angulation or significant listhesis. Vertebrae: There is mild T2 hyperintensity within the C6-7 disc. There is adjacent endplate T2 hyperintensity, T1 hypointensity and diffuse marrow enhancement within the C6 and C7 vertebral bodies consistent with osteomyelitis. No significant pathologic fracture. No other significant marrow signal abnormalities in the cervical spine. Cord: Normal in signal and caliber. No abnormal intradural enhancement. Posterior Fossa, vertebral arteries, paraspinal tissues: The visualized craniocervical junction appears normal. There are bilateral vertebral artery flow voids. There are anterior paraspinal inflammatory changes within the prevertebral soft tissues at C6-7 without focal fluid collection. No epidural fluid collections are seen. Disc levels: Multilevel cervical spondylosis with disc bulging, uncinate spurring and facet hypertrophy. There is no cord compression, although multilevel mild-to-moderate foraminal narrowing is present from C3-4 through C6-7. As above, findings of discitis and early osteomyelitis at C6-7 without associated epidural fluid collection. There are anterior paraspinal inflammatory changes with heterogeneous enhancement, but no focal fluid collection. MRI THORACIC SPINE FINDINGS Alignment:  Normal. Vertebrae: No evidence of acute fracture, discitis or osteomyelitis within the thoracic spine. Cord: Normal in signal and caliber. No abnormal intradural enhancement. Paraspinal and other soft tissues: The thoracic paraspinal soft tissues appear  unremarkable. Trace bilateral pleural effusions. Patchy pulmonary opacities are noted, suboptimally evaluated by MRI, but likely reflecting atelectasis or edema based on earlier chest radiographs. Disc levels: The thoracic discs appear normal. No disc herniation, spinal stenosis or nerve root encroachment identified in the thoracic spine. MRI LUMBAR SPINE FINDINGS Segmentation:  There are 5 lumbar type vertebral bodies. Alignment: Trace retrolisthesis at L1-2 and anterolisthesis at L5-S1. Vertebrae: There are changes of advanced discitis and osteomyelitis at L1-2. There is endplate destruction, loss of vertebral body height and diffuse marrow enhancement throughout the L1 and L2 vertebral bodies. The intervening disc demonstrates heterogeneous T2 hyperintensity and enhancement. No additional levels of discitis/osteomyelitis identified. No definite infected facet joints are seen. Conus medullaris and cauda equina: Conus extends to the L1-2 level. There is mass effect on the thecal sac by anterior epidural inflammation at L1-2, but no gross conus compression or abnormal intradural enhancement. Paraspinal and other soft tissues: Extensive paraspinal inflammatory changes at L1-2 with a small peripherally enhancing fluid collection within the right psoas muscle adjacent to the L1 vertebral body, measuring approximately 2.2 x 0.8 x 1.5 cm. No other focal paraspinal fluid collections are identified. There is anterior epidural inflammation at L1-2 extending into both foramina. No epidural fluid collection identified. The spleen is incompletely imaged, although appears moderately enlarged. There is generalized soft tissue edema with evidence  of at least moderate ascites. Disc levels: T12-L1: Normal interspace. L1-2: Advanced changes of diskitis and osteomyelitis with annular disc bulging and extensive anterior epidural inflammation extending into both foramina. There is moderate mass effect on the thecal sac with moderate  foraminal narrowing bilaterally, worse on the left. L2-3: Normal interspace. L3-4: Normal interspace. L4-5: Disc height and hydration are maintained. Mild facet and ligamentous hypertrophy. No significant spinal stenosis. L5-S1: Mild disc bulging with endplate osteophytes asymmetric to the right. Mild bilateral facet hypertrophy. Mild right foraminal narrowing. IMPRESSION: 1. Findings consistent with discitis and osteomyelitis at C6-7. No associated cervical paraspinal or epidural abscess or mass effect on the cord. 2. 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 There is a small probable abscess within the right psoas muscle and mass effect on the thecal sac and both neural foramina. No cord compression or drainable epidural fluid collections identified. 3. No significant thoracic spine findings. 4. Multilevel cervical spondylosis as described with foraminal narrowing bilaterally from C3-4 through C6-7 due to spondylosis. 5. Anasarca with generalized soft tissue edema, ascites and small bilateral pleural effusions. Electronically Signed   By: Richardean Sale M.D.   On: 07/11/2021 10:43   US Abdomen Complete  Result Date: 07/11/2021 CLINICAL DATA:  Liver failure.  Cirrhosis. EXAM: ABDOMEN ULTRASOUND COMPLETE COMPARISON:  None. FINDINGS: Gallbladder: No gallstones or wall thickening visualized. No sonographic Murphy sign noted by sonographer. Common bile duct: Diameter: 5 mm Liver: Cirrhosis. Portal vein is patent on color Doppler imaging with normal direction of blood flow towards the liver. IVC: No abnormality visualized. Pancreas: Visualized portion unremarkable. Spleen: Splenomegaly measuring 19 cm in length. Right Kidney: Length: 10 cm. Echogenicity within normal limits. No mass or hydronephrosis visualized. Left Kidney: Length: 10 cm. Echogenicity within normal limits. No mass or hydronephrosis visualized.  Abdominal aorta: No aneurysm visualized. Other findings: There is a small ascites. IMPRESSION: 1. Cirrhosis with evidence of portal hypertension, splenomegaly, and ascites. 2. Patent main portal vein with hepatopetal flow. Electronically Signed   By: Anner Crete M.D.   On: 07/11/2021 22:23   DG Chest Port 1 View  Result Date: 07/11/2021 CLINICAL DATA:  Chronic lower back pain. EXAM: PORTABLE CHEST 1 VIEW COMPARISON:  None. FINDINGS: A right-sided venous catheter is seen with its distal tip noted at the junction of the superior vena cava and right atrium. Mild linear atelectasis is present within the bilateral lung bases. There is no evidence of a pleural effusion or pneumothorax. The heart size and mediastinal contours are within normal limits. The visualized skeletal structures are unremarkable. IMPRESSION: 1. Right-sided venous catheter positioning, as described above. 2. Mild bibasilar linear atelectasis. Electronically Signed   By: Virgina Norfolk M.D.   On: 07/11/2021 00:36    Impression: Cirrhosis - HGB 6.1, 7.7 yesterday. (baseline 8-10), MCV 103.8; macrocytic anemia with normal B12 and Folate. Iron 105. - MELD-Na 17, MELD 14 - BUN 23, Cr 1.12 - Platelets 112 - INR 1.5 - AST 38/ALT 21/ Alk phos 58 - T bili 1.8 - Ammonia 109 - US abdomen 11/27: Cirrrhosis with evidence of portal hypertension, splenomegaly, and ascites. Patent main portal vein with hepatopetal flow. - Scheduled for paracentesis   Discitis and osteomyelitis  Hepatitis C  AKI  Polysubstance abuse - Normally on suboxone  Plan: Continue Lactulose, titrate to 2-3Bms/day Continue Xifaxan. Continue with paracentesis, send off for cytology since previous paracentesis' did not have fluid analysis. Continue supportive care. [facility]  will follow    LOS: 1 day   Garnette Scheuermann  PA-C 07/12/2021, 8:35 AM  Contact #  [phone]

## 2021-07-13 ENCOUNTER — Inpatient Hospital Stay (HOSPITAL_COMMUNITY): Payer: Self-pay

## 2021-07-13 ENCOUNTER — Other Ambulatory Visit (HOSPITAL_COMMUNITY): Payer: Self-pay

## 2021-07-13 DIAGNOSIS — E43 Unspecified severe protein-calorie malnutrition: Secondary | ICD-10-CM | POA: Insufficient documentation

## 2021-07-13 LAB — TYPE AND SCREEN
ABO/RH(D): A NEG
Antibody Screen: NEGATIVE
Unit division: 0
Unit division: 0

## 2021-07-13 LAB — BPAM RBC
Blood Product Expiration Date: 202212262359
Blood Product Expiration Date: 202212282359
ISSUE DATE / TIME: 202211281023
ISSUE DATE / TIME: 202211281407
Unit Type and Rh: 600
Unit Type and Rh: 600

## 2021-07-13 LAB — HEMOGLOBIN AND HEMATOCRIT, BLOOD
HCT: 26.5 % — ABNORMAL LOW (ref 36.0–46.0)
Hemoglobin: 9 g/dL — ABNORMAL LOW (ref 12.0–15.0)

## 2021-07-13 LAB — CREATININE, SERUM
Creatinine, Ser: 1.17 mg/dL — ABNORMAL HIGH (ref 0.44–1.00)
GFR, Estimated: 54 mL/min — ABNORMAL LOW (ref >60)

## 2021-07-13 IMAGING — US US ABDOMEN LIMITED
1 series · 5 of 5 positions shown · non-contrast
Comparison: [DATE]

CLINICAL DATA: Liver failure, cirrhosis, ascites

EXAM:
LIMITED ABDOMEN ULTRASOUND FOR ASCITES
TECHNIQUE: Limited ultrasound survey for ascites was performed in all four
abdominal quadrants.

[Series 1: us paracentesis mc & wl · 5 of 5 slices shown]
[im 1/5]
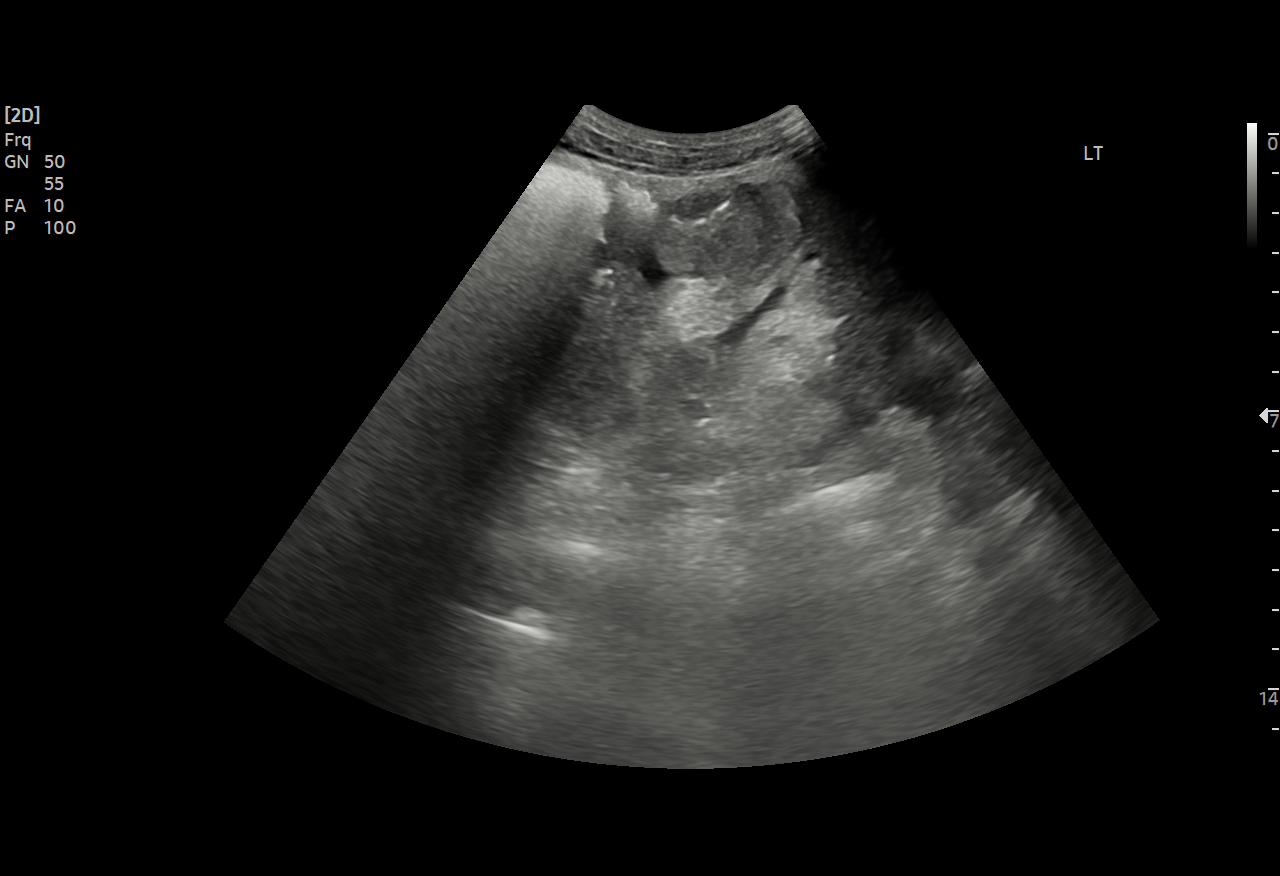
[im 2/5]
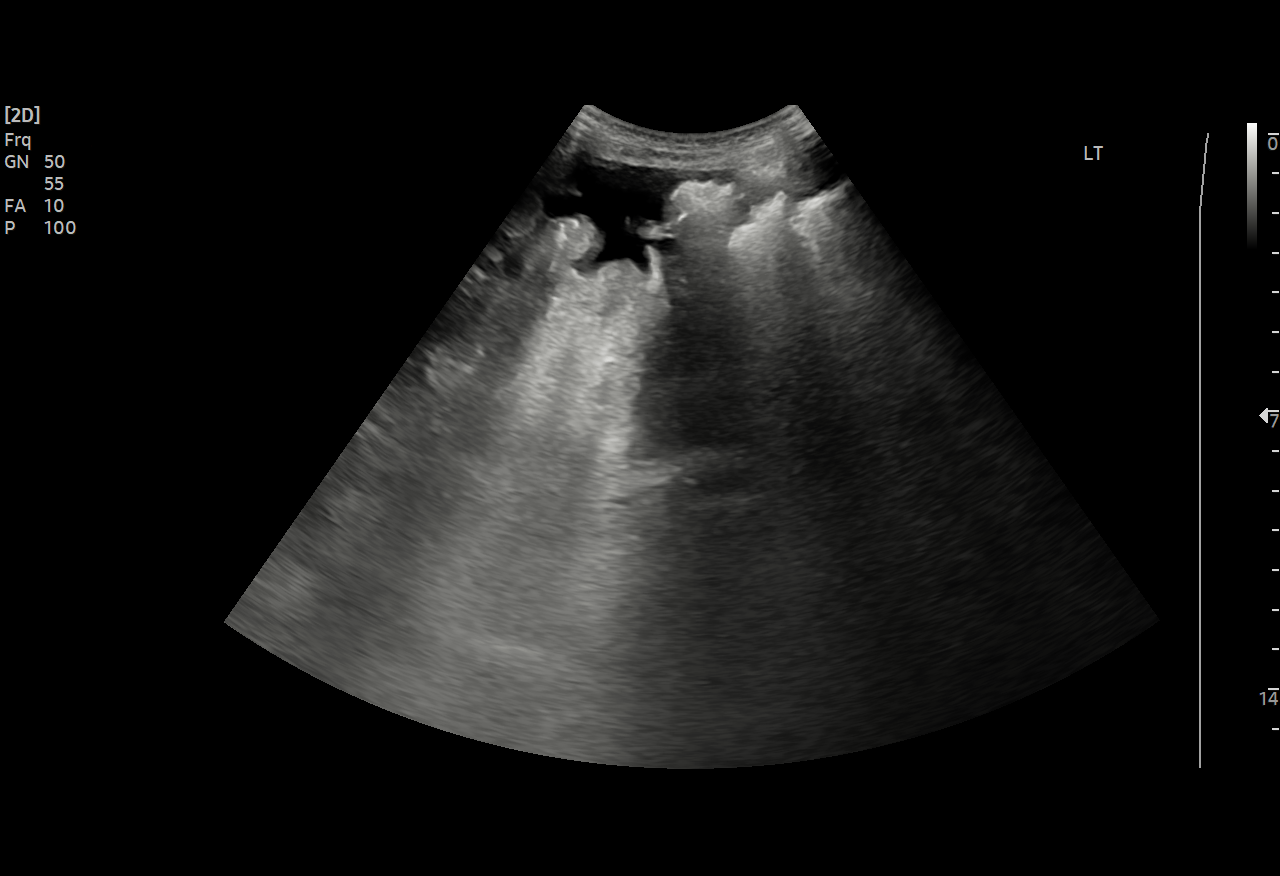
[im 3/5]
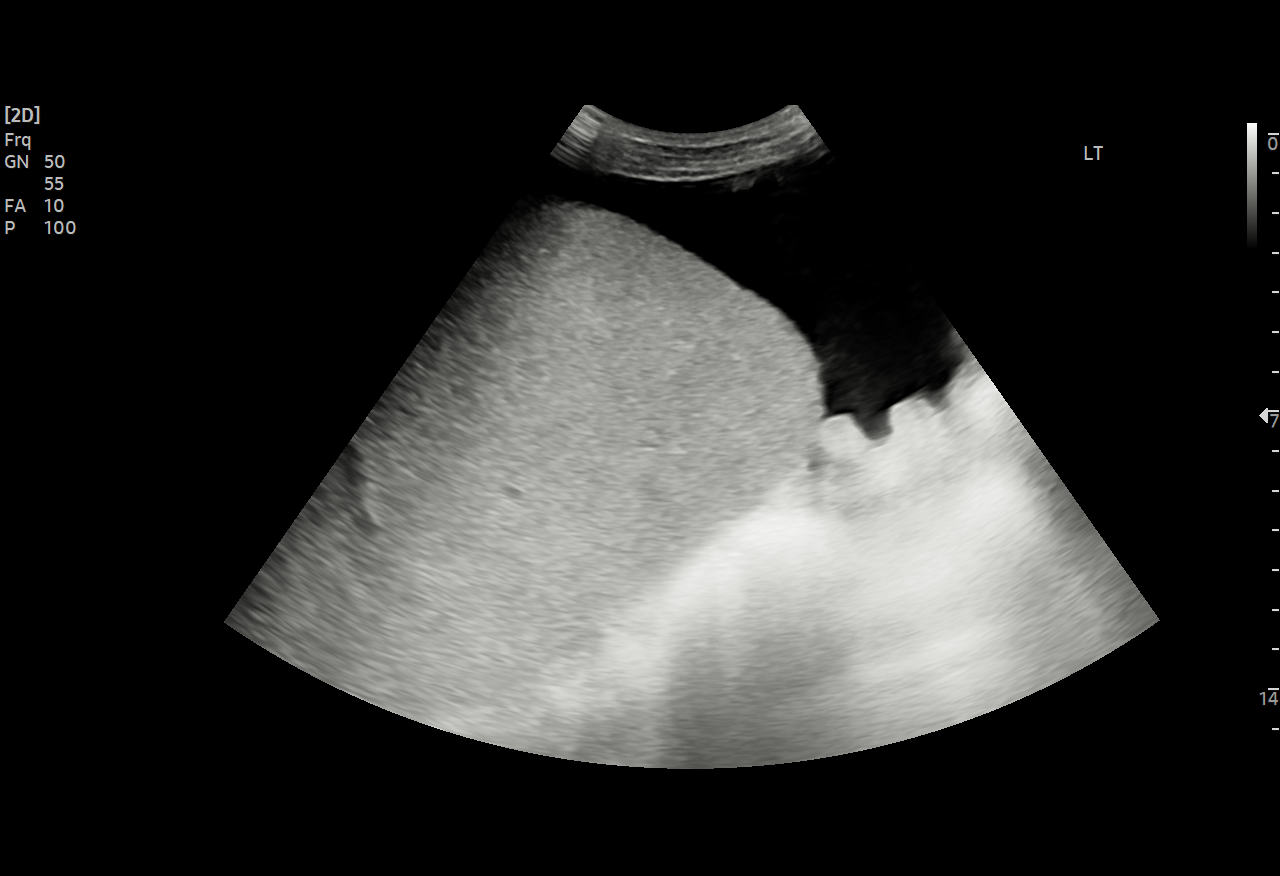
[im 4/5]
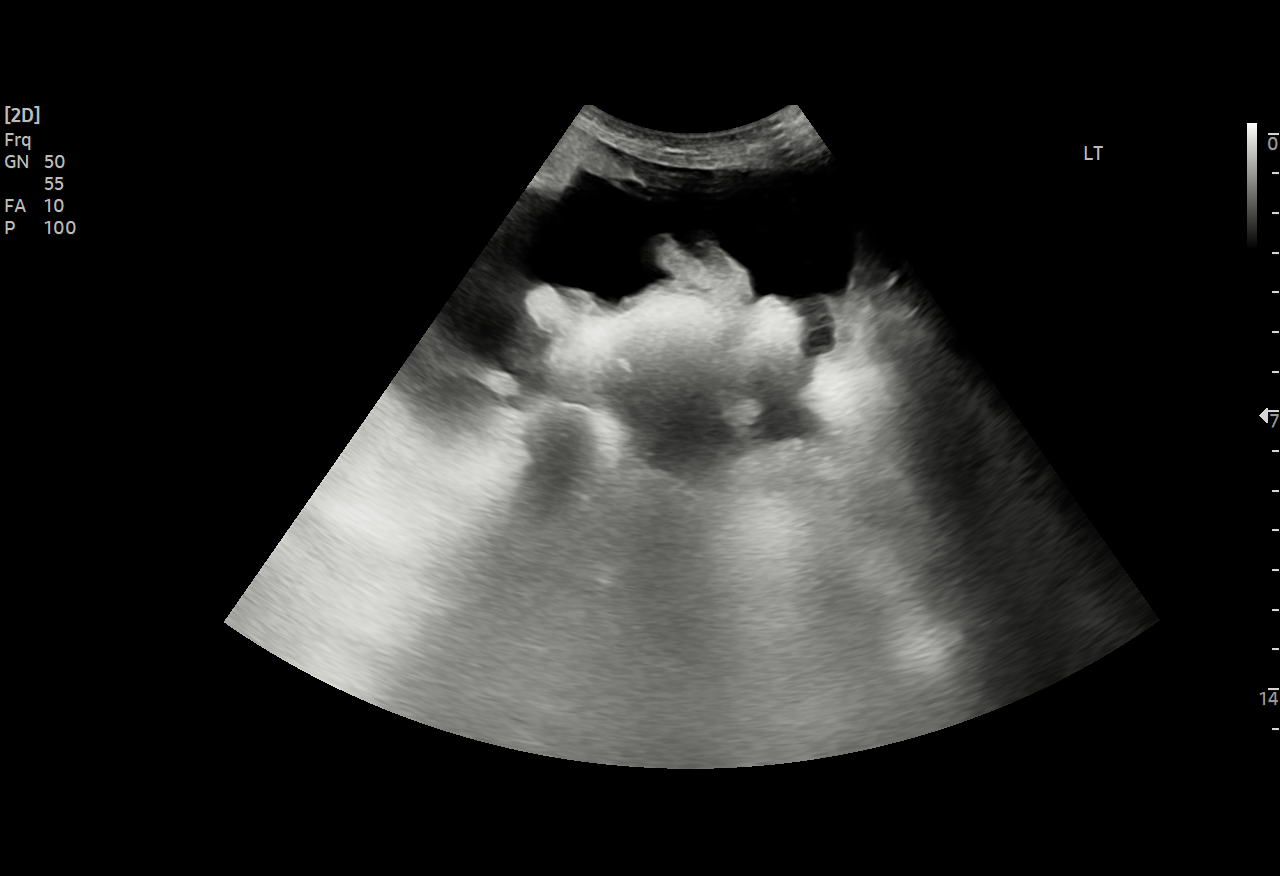
[im 5/5]
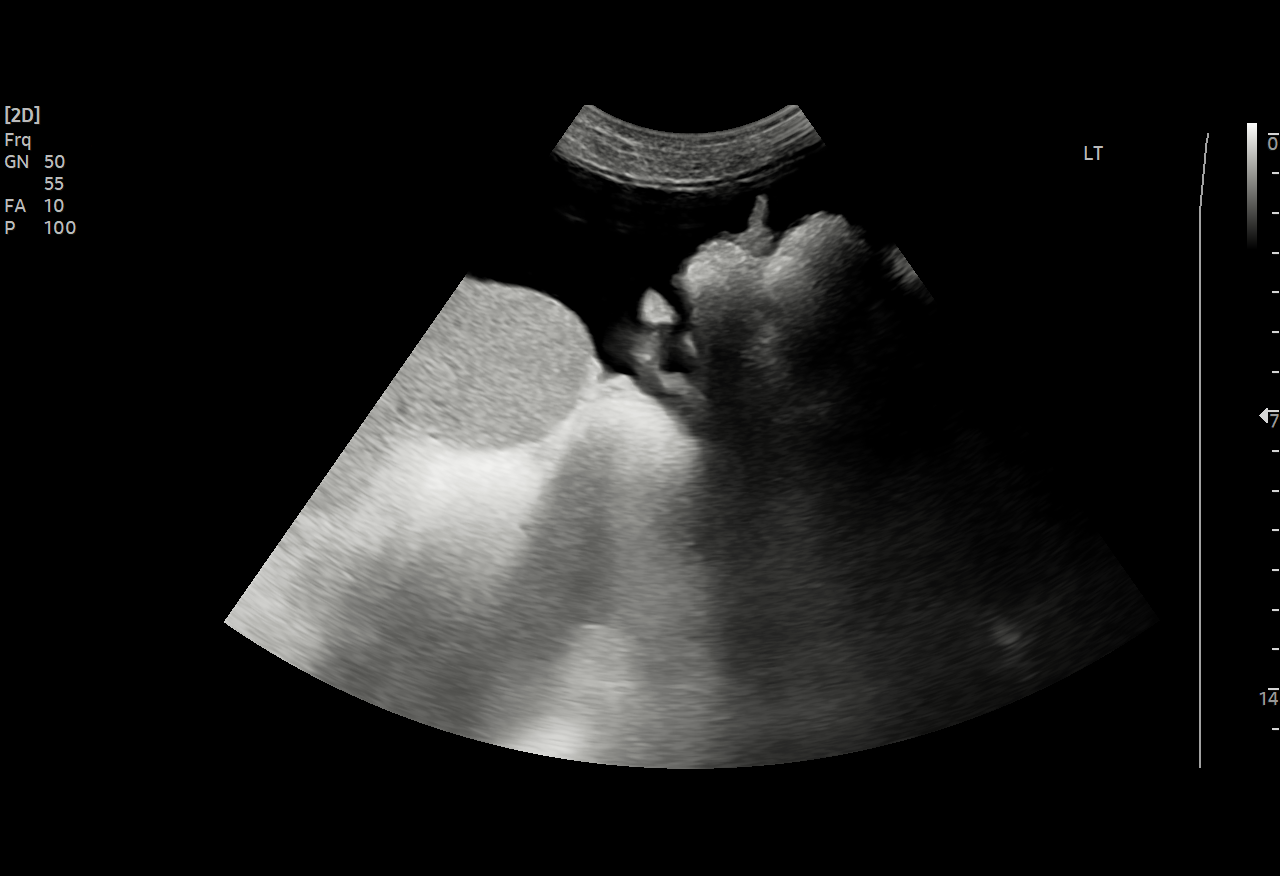

[5 of 5 positions shown; findings below may reference images not displayed]

FINDINGS: Small volume scattered abdominal ascites. No dominant large pocket.
IMPRESSION: Small volume abdominal ascites.  Paracentesis deferred.

## 2021-07-13 MED ORDER — PROSOURCE PLUS PO LIQD
30.0000 mL | Freq: Three times a day (TID) | ORAL | Status: DC
  Administered 2021-07-17: 30 mL via ORAL
  Filled 2021-07-13 (×5): qty 30

## 2021-07-13 MED ORDER — PROCHLORPERAZINE EDISYLATE 10 MG/2ML IJ SOLN
5.0000 mg | Freq: Once | INTRAMUSCULAR | Status: AC
  Administered 2021-07-13: 5 mg via INTRAVENOUS

## 2021-07-13 MED ORDER — PROCHLORPERAZINE EDISYLATE 10 MG/2ML IJ SOLN
INTRAMUSCULAR | Status: AC
  Filled 2021-07-13: qty 2

## 2021-07-13 MED ORDER — PROSOURCE PLUS PO LIQD
ORAL | Status: AC
  Filled 2021-07-13: qty 30

## 2021-07-13 MED ORDER — PROCHLORPERAZINE EDISYLATE 10 MG/2ML IJ SOLN
10.0000 mg | Freq: Four times a day (QID) | INTRAMUSCULAR | Status: DC | PRN
  Administered 2021-07-18: 22:00:00 10 mg via INTRAVENOUS
  Filled 2021-07-13: qty 2

## 2021-07-13 MED ORDER — LIDOCAINE HCL 1 % IJ SOLN
INTRAMUSCULAR | Status: AC
  Filled 2021-07-13: qty 20

## 2021-07-13 MED ORDER — HYDROMORPHONE HCL 1 MG/ML IJ SOLN
0.5000 mg | Freq: Four times a day (QID) | INTRAMUSCULAR | Status: DC | PRN
  Administered 2021-07-16 – 2021-07-19 (×10): 0.5 mg via INTRAVENOUS
  Filled 2021-07-13 (×11): qty 0.5

## 2021-07-13 NOTE — Progress Notes (Signed)
Initial Nutrition Assessment  DOCUMENTATION CODES:   Severe malnutrition in context of chronic illness  INTERVENTION:   -[employer] Instant Breakfast TID, each provides 220 kcals and 13g protein  -Multivitamin with minerals daily  -Prosource Plus PO TID, each provides 100 kcals and 15g protein  -Chocolate milk and chocolate ice cream with every tray  NUTRITION DIAGNOSIS:   Severe Malnutrition related to chronic illness (liver cirrhosis with ascites) as evidenced by energy intake < or equal to 75% for > or equal to 1 month, moderate fat depletion, severe muscle depletion.  GOAL:   Patient will meet greater than or equal to 90% of their needs  MONITOR:   PO intake, Supplement acceptance, Labs, Weight trends, I & O's  REASON FOR ASSESSMENT:   Malnutrition Screening Tool    ASSESSMENT:   55 year old female history of hep C, polysubstance abuse, bipolar disorder, cirrhosis. Patient noted at skilled nursing facility to have significant severe lower back pain, concern for fevers, EMS was called and patient brought to the ED.  Patient seen in the ED MRI of the C, T, L-spine done consistent with discitis and osteomyelitis of C6-C7, L1 and L2 with pathologic fracture and extensive paraspinal and epidural inflammatory changes as well as abscess in the right psoas muscle.  Patient in room with RN provided medications. Pt reports eating PTA, states she never has a problem with her appetite. Ate eggs, pancakes this morning for breakfast. RN also reports having ice cream as well. Pt states she is currently homeless, picks up food for her meals recently. Typically consumes 2 meals daily.  Pt with poor dentition, denies issues with chewing or swallowing.  Does not like Ensure/Boost supplements. Willing to try [employer] packets. Encouraged to drink milk with every meal. Have also ordered for ice cream with every meal as well.   Per weight records, pt weighed 124 lbs on 11/17,  uncertain if weight loss is fluid related.   Medications: Lasix, Lactulose, Multivitamin with minerals daily, Zofran  Labs reviewed.  NUTRITION - FOCUSED PHYSICAL EXAM:  Flowsheet Row Most Recent Value  Orbital Region No depletion  Upper Arm Region Severe depletion  Thoracic and Lumbar Region Unable to assess  Buccal Region Moderate depletion  Temple Region Severe depletion  Clavicle Bone Region Severe depletion  Clavicle and Acromion Bone Region Severe depletion  Scapular Bone Region Severe depletion  Dorsal Hand Mild depletion  Patellar Region No depletion  [edema]  Anterior Thigh Region No depletion  Posterior Calf Region No depletion  Edema (RD Assessment) Moderate  Hair Reviewed  [reports hair loss]  Eyes Reviewed  Mouth Reviewed  [missing teeth, denies chewing issues]  Skin Reviewed  Nails Reviewed       Diet Order:   Diet Order             Diet Heart Room service appropriate? Yes; Fluid consistency: Thin  Diet effective now                   EDUCATION NEEDS:   Education needs have been addressed  Skin:  Skin Assessment: Reviewed RN Assessment  Last BM:  PTA  Height:   Ht Readings from Last 1 Encounters:  07/11/21 5\' 1"  (1.549 m)    Weight:   Wt Readings from Last 1 Encounters:  07/11/21 52.2 kg    BMI:  Body mass index is 21.73 kg/m.  Estimated Nutritional Needs:   Kcal:  1550-1750  Protein:  75-90g  Fluid:  1.8L/day  07/13/21  Christina Shu, MS, RD, LDN Inpatient Clinical Dietitian Contact information available via Amion

## 2021-07-13 NOTE — Progress Notes (Signed)
[facility]    Date of Admission:  07/10/2021     ID: Christina Wyatt is a 55 y.o. 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 Abdomen still distended but she is passing gas. Has not had BM. Unable to do any paracentesis -not enough ascites  Medications:   (feeding supplement) PROSource Plus  30 mL Oral TID   buprenorphine-naloxone  2 tablet Sublingual BID   Chlorhexidine Gluconate Cloth  6 each Topical Daily   furosemide  40 mg Oral Daily   gabapentin  200 mg Oral TID    HYDROmorphone (DILAUDID) injection  0.5 mg Intravenous Once   lactulose  20 g Oral BID   methocarbamol  500 mg Oral TID   multivitamin with minerals  1 tablet Oral Daily   rifaximin  550 mg Oral BID   sodium chloride flush  10-40 mL Intracatheter Q12H   spironolactone  100 mg Oral Daily    Objective: Vital signs in last 24 hours: Temp:  [97.6 F (36.4 C)-99.5 F (37.5 C)] 99.5 F (37.5 C) (11/29 1325) Pulse Rate:  [81-92] 81 (11/29 1325) Resp:  [14-18] 15 (11/29 1325) BP: (122-132)/(70-85) 132/82 (11/29 1325) SpO2:  [94 %-98 %] 95 % (11/29 1325)  Physical Exam  Constitutional:  oriented to person, place, and time. appears older than stated age and mal-nourished. No distress.  HENT: /AT, PERRLA, no scleral icterus Mouth/Throat: Oropharynx is clear and moist. No oropharyngeal exudate.  Cardiovascular: Normal rate, regular rhythm and normal heart sounds. Exam reveals no gallop and no  friction rub.  No murmur heard.  Chest wall = right subclavian line is well dressed, no erythema Pulmonary/Chest: Effort normal and breath sounds normal. No respiratory distress.  has no wheezes.  Neck = supple, no nuchal rigidity Abdominal: Bowel sounds are soft. Protuberant - 69month gestation type of distension. There is no tenderness.  Lymphadenopathy: no cervical adenopathy. No axillary adenopathy Neurological: alert and oriented to person, place, and time.  Skin: Skin is warm and dry. No rash noted. No erythema.  Psychiatric: a normal mood and affect.  behavior is normal.    Lab Results Recent Labs    07/11/21 0132 07/12/21 0339 07/13/21 0016 07/13/21 0402  WBC 2.9* 2.3*  --   --   HGB 7.7* 6.1* 9.0*  --   HCT 24.1* 19.0* 26.5*  --   NA 133* 133*  --   --   K 4.3 4.1  --   --   CL 106 108  --   --   CO2 20* 19*  --   --   BUN 32* 23*  --   --   CREATININE 1.58* 1.12*  --  1.17*   Liver Panel Recent Labs    07/11/21 0132 07/12/21 0339  PROT 8.7* 7.3  ALBUMIN 2.4* 2.1*  AST 51* 38  ALT 25 21  ALKPHOS 77 58  BILITOT 2.4* 1.8*     Microbiology: Blood cx ngtd  Studies/Results: US Abdomen Complete  Result Date: 07/11/2021 CLINICAL DATA:  Liver failure.  Cirrhosis. EXAM: ABDOMEN ULTRASOUND COMPLETE COMPARISON:  None. FINDINGS: Gallbladder: No gallstones or wall thickening visualized. No sonographic Murphy sign noted by sonographer. Common bile duct: Diameter: 5 mm Liver: Cirrhosis. Portal vein is patent on color Doppler imaging with normal direction of blood flow towards the liver. IVC: No abnormality visualized. Pancreas: Visualized portion unremarkable. Spleen: Splenomegaly measuring 19 cm in length. Right Kidney: Length: 10 cm. Echogenicity within normal limits. No mass or hydronephrosis visualized. Left Kidney: Length: 10 cm. Echogenicity within normal limits. No mass or hydronephrosis visualized. Abdominal aorta: No aneurysm visualized. Other findings: There is a  small ascites. IMPRESSION: 1. Cirrhosis with evidence of portal hypertension, splenomegaly, and ascites. 2. Patent main portal vein with hepatopetal flow. Electronically Signed   By: Elgie Collard M.D.   On: 07/11/2021 22:23   [place] ASCITES (ABDOMEN LIMITED)  Result Date: 07/13/2021 CLINICAL DATA:  Liver failure, cirrhosis, ascites EXAM: LIMITED ABDOMEN ULTRASOUND FOR ASCITES TECHNIQUE: Limited ultrasound survey for ascites was performed in all four abdominal quadrants. COMPARISON:  07/11/2021 FINDINGS: Small volume scattered abdominal ascites. No dominant large pocket. IMPRESSION: Small volume abdominal ascites.  Paracentesis deferred. Electronically Signed   By: Corlis Leak M.D.   On: 07/13/2021 10:06   [place] EKG SITE RITE  Result Date: 07/12/2021 If Site Rite image not attached, placement could not be confirmed due to current cardiac rhythm.    Assessment/Plan: MRSA bacteremia with vertebral osteomyelitis and epidural abscess, on daptomycin = continue with the original care plan as outlined by her providers at [facility], through jan 7th, 2022. May need to re-schedule her upcoming visit dec 7th with dr Zachery Dauer, ID at [facility]. Continue with weekly bmp, and ck levels. 500mg  daily gives level of 9mg /kg dosing.  Cirrhosis = appreciate GI involvement for her liver disease management.  Continue on lasix 40/spirino 100mg . Plus lactulose and rifaximin to minize encephalopathy  Back pain = defer to primary team. On suboxone for history of opiate use  Will sign off.  [facility] Pager: [phone]  07/13/2021, 4:49 PM

## 2021-07-13 NOTE — Progress Notes (Addendum)
PROGRESS NOTE    Christina Wyatt  OEU:[id number] DOB: 05-Sep-1965 DOA: 07/10/2021 PCP: Georgann Housekeeper, MD    Chief Complaint  Patient presents with   Back Pain    Lower Back x4 hrs    Brief Narrative:  Patient 55 year old female history of hep C, polysubstance abuse, bipolar disorder, cirrhosis, who was admitted to [facility] on 11/5 with worsening abdominal pain, lower extremity swelling, decreased appetite, worsening chronic back pain with subjective fevers and chills as well as shortness of breath and tachypnea.  Patient admitted at [facility] hospital with concerns for sepsis noted to have a bacteremia which grew MRSA, patient underwent MRI of the C-spine which showed discitis and osteomyelitis at C5, C6-C7, epidural thickening from C5-C6 disc space down to near C7 and T1 concerning for epidural abscess.  Patient underwent a 2D echo, TEE negative for endocarditis.  Admission at that time complicated with a thrombosed basilic vein on the left as well as phlebitis on the right with some purulence.  Patient also noted to have severe cirrhosis of the liver presumably from hep C and alcohol abuse.  During that admission patient underwent diagnostic and therapeutic paracentesis x3, placed on Vanco and cefepime. Repeat blood cultures obtained which showed clearance patient narrowed to vancomycin and discharged to SNF.  Tunneled catheter placed by IR and patient was to be discharged on daptomycin versus vancomycin. -At skilled nursing facility patient noted to have worsening renal function and supposedly was changed from IV Vanco IV daptomycin. -Patient noted at skilled nursing facility to have significant severe lower back pain, concern for fevers, EMS was called and patient brought to the ED.  Patient seen in the ED MRI of the C, T, L-spine done consistent with discitis and osteomyelitis of C6-C7, L1 and L2 with pathologic fracture and extensive paraspinal and epidural  inflammatory changes as well as abscess in the right psoas muscle.  No infection noted on the T-spine.  ID consulted patient placed on IV vancomycin initially and changed back to IV daptomycin per ID recommendations.  Patient seen by neurosurgery and feel no surgical intervention needed at this time.  GI consulted to help optimize cirrhosis.  Palliative care consultation pending   Assessment & Plan:   Principal Problem:   Diskitis Active Problems:   IVDU (intravenous drug user)   Chronic hepatitis C (HCC)   MRSA bacteremia   Other cirrhosis of liver (HCC)   Anemia   Ascites of liver   AKI (acute kidney injury) (HCC)   Pancytopenia (HCC)   Cirrhosis of liver with ascites (HCC)   Osteomyelitis of cervical spine (HCC)   Protein-calorie malnutrition, severe  1 C6-C7 discitis and osteomyelitis/new L1-L2 discitis and osteomyelitis with pathologic fracture, paraspinal epidural inflammatory changes and probable abscess of the right psoas muscle.  Sepsis ruled out -History as noted above. -Patient seen in consultation by ID who recommended switching back from vancomycin to daptomycin. -Neurosurgery contacted and feel no neurosurgical intervention needed at this time. -ID following and appreciate input and recommendations.  2.  MRSA bacteremia -2D echo, TEE done at outside hospital negative for endocarditis. -Continue IV daptomycin. -ID following.  3.  Cirrhosis with ascites/chronic hepatitis C/hyperbilirubinemia -Likely secondary to chronic hep C and possible history of alcohol use. -Abdomen significantly distended, tight. -Diagnostic and therapeutic ultrasound-guided paracentesis ordered and attempted today however very small pocket of fluid and not enough will paracentesis and as such paracentesis canceled.  Patient noted to have a large spleen.. -Continue lactulose twice  daily, Xifaxan, Lasix, spironolactone. -GI following appreciate input and recommendations.  4.  IV drug use and  alcohol abuse -Continue Suboxone. -Changed IV morphine to IV Dilaudid as patient with significant severe back pain from discitis and osteomyelitis as well as significant ascites. -Patient also on oxycodone as needed. -Change IV Dilaudid to 0.5 mg every 6 hours as needed. -Would not recommend further escalation. -Trying to wean down narcotics as soon as possible.  5.  Pancytopenia -Likely secondary to cirrhosis. -No overt bleeding noted.  6.  Acute kidney injury -Baseline noted at 0.7-0.8. -Creatinine noted on admission 1.58. -Abdominal ultrasound negative for hydronephrosis -Creatinine down to 1.17 today. -Monitor with diuretics.  7.  Anemia -Patient with no overt bleeding. -Anemia panel consistent with anemia of chronic disease. -Transfused 2 units packed red blood cells. -H&H pending. -Follow H&H.     DVT prophylaxis: SCDs Code Status: DNR Family Communication: Updated patient, son, mother at bedside. Disposition:   Status is: Inpatient  Remains inpatient appropriate because: Severity of illness       Consultants:  ID: Dr.Van Dam 07/11/2021 IR Palliative care pending Gastroenterology: Dr.Magod 07/12/2021  Procedures: Chest x-ray 07/11/2021>>>> MRI C/L/T-spine 07/11/2021 Ultrasound-guided paracentesis unable to be done due to inadequate fluid noted on ultrasound.  Antimicrobials:  IV daptomycin 07/12/2021 IV vancomycin 07/11/2021>>>> 07/12/2021   Subjective: Patient sleeping but arousable.  Stated had some nausea and emesis last night, none this morning.  Overall feeling better.  States has not been able to urinate well today.  Denies any significant abdominal pain.  Was taken down for ultrasound-guided paracentesis however unable to be done as no fluid on the right and very small fluid pocket on the left noted not large enough for safe access and as such paracentesis canceled.    Objective: Vitals:   07/12/21 1650 07/12/21 1900 07/12/21 1947 07/13/21  0426  BP: 125/72 122/70 125/85 123/79  Pulse: 88 92 84 86  Resp: 14 16 18 14   Temp: 97.6 F (36.4 C) 97.7 F (36.5 C) 98.1 F (36.7 C) 99.1 F (37.3 C)  TempSrc: Oral Other (Comment) Oral Oral  SpO2: 98% 98% 96% 94%  Weight:      Height:        Intake/Output Summary (Last 24 hours) at 07/13/2021 1300 Last data filed at 07/13/2021 0300 Gross per 24 hour  Intake 719.17 ml  Output 450 ml  Net 269.17 ml    Filed Weights   07/11/21 0005  Weight: 52.2 kg    Examination:  General exam: Frail.  Emaciated.  Chronically ill-appearing.  Poor dentition.  Right chest wall with subclavian line with no signs of infection. Respiratory system: Lungs clear to auscultation bilaterally.  Some decreased breath sounds in the bases.  Fair air movement.  Speaking in full sentences.  Normal respiratory effort.   Cardiovascular system: Regular rate and rhythm no murmurs rubs or gallops.  No JVD.  1-2+ bilateral lower extremity edema.  Gastrointestinal system: Abdomen is distended, tight, decreased tenderness to palpation diffusely.  Positive bowel sounds.  No rebound.  No guarding.   Central nervous system: Alert. No focal neurological deficits.  Moving extremities spontaneously. Extremities: Symmetric 5 x 5 power. Skin: No rashes, lesions or ulcers Psychiatry: Judgement and insight appear normal. Mood & affect appropriate.     Data Reviewed: I have personally reviewed following labs and imaging studies  CBC: Recent Labs  Lab 07/11/21 0132 07/12/21 0339 07/13/21 0016  WBC 2.9* 2.3*  --   NEUTROABS 1.9  --   --  HGB 7.7* 6.1* 9.0*  HCT 24.1* 19.0* 26.5*  MCV 104.8* 103.8*  --   PLT 120* 112*  --      Basic Metabolic Panel: Recent Labs  Lab 07/11/21 0132 07/12/21 0339 07/13/21 0402  NA 133* 133*  --   K 4.3 4.1  --   CL 106 108  --   CO2 20* 19*  --   GLUCOSE 107* 139*  --   BUN 32* 23*  --   CREATININE 1.58* 1.12* 1.17*  CALCIUM 8.3* 7.9*  --      GFR: Estimated  Creatinine Clearance: 40 mL/min (A) (by C-G formula based on SCr of 1.17 mg/dL (H)).  Liver Function Tests: Recent Labs  Lab 07/11/21 0132 07/12/21 0339  AST 51* 38  ALT 25 21  ALKPHOS 77 58  BILITOT 2.4* 1.8*  PROT 8.7* 7.3  ALBUMIN 2.4* 2.1*     CBG: No results for input(s): GLUCAP in the last 168 hours.   Recent Results (from the past 240 hour(s))  Blood Culture (routine x 2)     Status: None (Preliminary result)   Collection Time: 07/11/21  1:32 AM   Specimen: BLOOD  Result Value Ref Range Status   Specimen Description   Final    BLOOD BLOOD LEFT FOREARM Performed at [facility], [address] [place], [place] [address]    Special Requests   Final    BOTTLES DRAWN AEROBIC AND ANAEROBIC Blood Culture results may not be optimal due to an inadequate volume of blood received in culture bottles Performed at [facility], [address] [place], [place] [address]    Culture   Final    NO GROWTH 2 DAYS Performed at [facility], [address], [place], [place] [address]    Report Status PENDING  Incomplete  Blood Culture (routine x 2)     Status: None (Preliminary result)   Collection Time: 07/11/21  1:34 AM   Specimen: BLOOD  Result Value Ref Range Status   Specimen Description   Final    BLOOD BLOOD RIGHT FOREARM Performed at [facility], [address] [place], [place] [address]    Special Requests   Final    BOTTLES DRAWN AEROBIC AND ANAEROBIC Blood Culture adequate volume Performed at [facility], [address], [place], [place] [address]    Culture   Final    NO GROWTH 2 DAYS Performed at [facility], [address], [place], [place] [address]    Report Status PENDING  Incomplete  Resp Panel by RT-PCR (Flu A&B, Covid) Nasopharyngeal Swab     Status: None   Collection Time: 07/11/21 12:47 PM   Specimen: Nasopharyngeal Swab; Nasopharyngeal(NP) swabs in vial  transport medium  Result Value Ref Range Status   SARS Coronavirus 2 by RT PCR NEGATIVE NEGATIVE Final    Comment: (NOTE) SARS-CoV-2 target nucleic acids are NOT DETECTED.  The SARS-CoV-2 RNA is generally detectable in upper respiratory specimens during the acute phase of infection. The lowest concentration of SARS-CoV-2 viral copies this assay can detect is 138 copies/mL. A negative result does not preclude SARS-Cov-2 infection and should not be used as the sole basis for treatment or other patient management decisions. A negative result may occur with  improper specimen collection/handling, submission of specimen other than nasopharyngeal swab, presence of viral mutation(s) within the areas targeted by this assay, and inadequate number of viral copies(<138 copies/mL). A negative  result must be combined with clinical observations, patient history, and epidemiological information. The expected result is Negative.  Fact Sheet for Patients:  BloggerCourse.com  Fact Sheet for Healthcare Providers:  SeriousBroker.it  This test is no t yet approved or cleared by the [place] FDA and  has been authorized for detection and/or diagnosis of SARS-CoV-2 by FDA under an Emergency Use Authorization (EUA). This EUA will remain  in effect (meaning this test can be used) for the duration of the COVID-19 declaration under Section 564(b)(1) of the Act, 21 U.S.C.section 360bbb-3(b)(1), unless the authorization is terminated  or revoked sooner.       Influenza A by PCR NEGATIVE NEGATIVE Final   Influenza B by PCR NEGATIVE NEGATIVE Final    Comment: (NOTE) The Xpert Xpress SARS-CoV-2/FLU/RSV plus assay is intended as an aid in the diagnosis of influenza from Nasopharyngeal swab specimens and should not be used as a sole basis for treatment. Nasal washings and aspirates are unacceptable for Xpert Xpress SARS-CoV-2/FLU/RSV testing.  Fact  Sheet for Patients: BloggerCourse.com  Fact Sheet for Healthcare Providers: SeriousBroker.it  This test is not yet approved or cleared by the [place] FDA and has been authorized for detection and/or diagnosis of SARS-CoV-2 by FDA under an Emergency Use Authorization (EUA). This EUA will remain in effect (meaning this test can be used) for the duration of the COVID-19 declaration under Section 564(b)(1) of the Act, 21 U.S.C. section 360bbb-3(b)(1), unless the authorization is terminated or revoked.  Performed at [facility], [address], [place], [place] [address]           Radiology Studies: US Abdomen Complete  Result Date: 07/11/2021 CLINICAL DATA:  Liver failure.  Cirrhosis. EXAM: ABDOMEN ULTRASOUND COMPLETE COMPARISON:  None. FINDINGS: Gallbladder: No gallstones or wall thickening visualized. No sonographic Murphy sign noted by sonographer. Common bile duct: Diameter: 5 mm Liver: Cirrhosis. Portal vein is patent on color Doppler imaging with normal direction of blood flow towards the liver. IVC: No abnormality visualized. Pancreas: Visualized portion unremarkable. Spleen: Splenomegaly measuring 19 cm in length. Right Kidney: Length: 10 cm. Echogenicity within normal limits. No mass or hydronephrosis visualized. Left Kidney: Length: 10 cm. Echogenicity within normal limits. No mass or hydronephrosis visualized. Abdominal aorta: No aneurysm visualized. Other findings: There is a small ascites. IMPRESSION: 1. Cirrhosis with evidence of portal hypertension, splenomegaly, and ascites. 2. Patent main portal vein with hepatopetal flow. Electronically Signed   By: Elgie Collard M.D.   On: 07/11/2021 22:23   [place] ASCITES (ABDOMEN LIMITED)  Result Date: 07/13/2021 CLINICAL DATA:  Liver failure, cirrhosis, ascites EXAM: LIMITED ABDOMEN ULTRASOUND FOR ASCITES TECHNIQUE: Limited ultrasound survey for ascites was  performed in all four abdominal quadrants. COMPARISON:  07/11/2021 FINDINGS: Small volume scattered abdominal ascites. No dominant large pocket. IMPRESSION: Small volume abdominal ascites.  Paracentesis deferred. Electronically Signed   By: Corlis Leak M.D.   On: 07/13/2021 10:06   [place] EKG SITE RITE  Result Date: 07/12/2021 If Site Rite image not attached, placement could not be confirmed due to current cardiac rhythm.       Scheduled Meds:  (feeding supplement) PROSource Plus  30 mL Oral TID   buprenorphine-naloxone  2 tablet Sublingual BID   Chlorhexidine Gluconate Cloth  6 each Topical Daily   furosemide  40 mg Oral Daily   gabapentin  200 mg Oral TID    HYDROmorphone (DILAUDID) injection  0.5 mg Intravenous Once   lactulose  20 g Oral BID  methocarbamol  500 mg Oral TID   multivitamin with minerals  1 tablet Oral Daily   rifaximin  550 mg Oral BID   sodium chloride flush  10-40 mL Intracatheter Q12H   spironolactone  100 mg Oral Daily   Continuous Infusions:  albumin human     DAPTOmycin (CUBICIN)  IV Stopped (07/12/21 2220)     LOS: 2 days    Time spent: 40 minutes    Ramiro Harvest, MD [facility]   To contact the attending provider between 7A-7P or the covering provider during after hours 7P-7A, please log into the web site [email or web address] and access using universal [place] password for that web site. If you do not have the password, please call the hospital operator.  07/13/2021, 1:00 PM

## 2021-07-13 NOTE — TOC Initial Note (Signed)
Transition of Care [facility]) - Initial/Assessment Note    Patient Details  Name: Christina Wyatt MRN: [id number] Date of Birth: [birth year]  [facility]) CM/SW Contact:    Ida Rogue, LCSW Phone Number: 07/13/2021, 10:46 AM  Clinical Narrative:   Patient who comes to [place] from [facility] went there for course of IV abx due to her homeless status, on-going  use of IV heroin.  She states the plan for when she is medically stable to to stay with her mother in [place]. [facility] states that she was sent to them on the 19th of November with letter of guarantee from [facility], where she had been admitted on 11/5. They are also saying she needs to come back with LOG from [facility]. Will work with [facility] leadership to secure funding for continued stay at [facility] as she continues to need IV abx. TOC will continue to follow during the course of hospitalization.             Expected Discharge Plan: Home/Self Care Barriers to Discharge: No Barriers Identified   Patient Goals and CMS Choice        Expected Discharge Plan and Services Expected Discharge Plan: Home/Self Care   Discharge Planning Services: CM Consult   Living arrangements for the past 2 months: Skilled Nursing Facility [facility])                                      Prior Living Arrangements/Services Living arrangements for the past 2 months: Skilled Nursing Facility [facility]) Lives with:: Parents Patient language and need for interpreter reviewed:: Yes        Need for Family Participation in Patient Care: Yes (Comment) Care giver support system in place?: Yes (comment)   Criminal Activity/Legal Involvement Pertinent to Current Situation/Hospitalization: No - Comment as needed  Activities of Daily Living Home Assistive Devices/Equipment: None (pt intermittently confused) ADL Screening (condition at time of admission) Patient's cognitive ability adequate to safely  complete daily activities?: No Is the patient deaf or have difficulty hearing?: No Does the patient have difficulty seeing, even when wearing glasses/contacts?: No Does the patient have difficulty concentrating, remembering, or making decisions?: No Patient able to express need for assistance with ADLs?: Yes Does the patient have difficulty dressing or bathing?: Yes Independently performs ADLs?: No Communication: Independent Dressing (OT): Needs assistance Is this a change from baseline?: Pre-admission baseline Grooming: Needs assistance Feeding: Independent Bathing: Needs assistance Toileting: Needs assistance In/Out Bed: Needs assistance Walks in Home: Needs assistance Does the patient have difficulty walking or climbing stairs?: Yes Weakness of Legs: Both Weakness of Arms/Hands: None  Permission Sought/Granted Permission sought to share information with : Family Supports Permission granted to share information with : Yes, Verbal Permission Granted  Share Information with NAME: Phineas Real (Sister)   [phone], Patterson Hammersmith, mother           Emotional Assessment Appearance:: Appears older than stated age Attitude/Demeanor/Rapport: Lethargic Affect (typically observed): Appropriate Orientation: : Oriented to Self, Oriented to Situation Alcohol / Substance Use: Illicit Drugs Psych Involvement: No (comment)  Admission diagnosis:  SIRS (systemic inflammatory response syndrome) (HCC) [R65.10] Osteomyelitis of cervical spine (HCC) [M46.22] Sepsis (HCC) [A41.9] Cirrhosis of liver with ascites, unspecified hepatic cirrhosis type (HCC) [K74.60, R18.8] Left-sided low back pain without sciatica, unspecified chronicity [M54.50] Patient Active Problem List   Diagnosis Date Noted   Other  cirrhosis of liver (HCC) 07/12/2021   Anemia 07/12/2021   Ascites of liver 07/12/2021   AKI (acute kidney injury) (HCC) 07/12/2021   Pancytopenia (HCC) 07/12/2021   Cirrhosis of liver with  ascites (HCC)    Osteomyelitis of cervical spine (HCC)    Sepsis (HCC) 07/11/2021   Diskitis 07/11/2021   IVDU (intravenous drug user) 07/11/2021   MRSA bacteremia 07/11/2021   COVID-19 virus infection 09/15/2020   Chronic hepatitis C (HCC) 08/29/2011   PCP:  Georgann Housekeeper, MD Pharmacy:  No Pharmacies Listed    Social Determinants of Health (SDOH) Interventions    Readmission Risk Interventions No flowsheet data found.

## 2021-07-13 NOTE — Progress Notes (Signed)
Dressing changed after soiled with vomit. Insertion site pink. No exudate noted. Secure port skin adhesive applied per VAST protocol (please note this will have crust-like appearance.)

## 2021-07-13 NOTE — Progress Notes (Signed)
PHARMACY CONSULT NOTE  OUTPATIENT  PARENTERAL ANTIBIOTIC THERAPY (OPAT)  Indication: Discitis Regimen: daptomycin 500mg  IV q24h End date: 08/21/21  IV antibiotic discharge orders are pended. To discharging provider:  please sign these orders via discharge navigator,  Select New Orders & click on the button choice - Manage This Unsigned Work.     Thank you for allowing pharmacy to be a part of this patient's care.  10/19/21 07/13/2021, 3:13 PM

## 2021-07-13 NOTE — Progress Notes (Signed)
[facility] Gastroenterology Progress Note  Christina Wyatt 55 y.o. November 10, 1965  CC:  Cirrhosis   Subjective: Patient states she had some vomiting last night, had a sour taste. Was mainly nonbloody bilious vomit. Denies coffee ground emesis. States she feels much better this morning. Denies nausea, vomiting this morning. Denies abdominal pain, melena, hematochezia. Has multiple Bms yesterday, no bms today yet.  ROS : Review of Systems  Gastrointestinal:  Positive for nausea and vomiting. Negative for abdominal pain, blood in stool, constipation, diarrhea, heartburn and melena.  Genitourinary:  Negative for dysuria and urgency.     Objective: Vital signs in last 24 hours: Vitals:   07/12/21 1947 07/13/21 0426  BP: 125/85 123/79  Pulse: 84 86  Resp: 18 14  Temp: 98.1 F (36.7 C) 99.1 F (37.3 C)  SpO2: 96% 94%    Physical Exam:  General:  Alert, cooperative, no distress, appears stated age  Head:  Normocephalic, without obvious abnormality, atraumatic  Eyes:  Anicteric sclera, EOM's intact  Lungs:   Clear to auscultation bilaterally, respirations unlabored  Heart:  Regular rate and rhythm, S1, S2 normal  Abdomen:   Distended, nontender, bowel sounds active all four quadrants,  no masses,     Lab Results: Recent Labs    07/11/21 0132 07/12/21 0339 07/13/21 0402  NA 133* 133*  --   K 4.3 4.1  --   CL 106 108  --   CO2 20* 19*  --   GLUCOSE 107* 139*  --   BUN 32* 23*  --   CREATININE 1.58* 1.12* 1.17*  CALCIUM 8.3* 7.9*  --    Recent Labs    07/11/21 0132 07/12/21 0339  AST 51* 38  ALT 25 21  ALKPHOS 77 58  BILITOT 2.4* 1.8*  PROT 8.7* 7.3  ALBUMIN 2.4* 2.1*   Recent Labs    07/11/21 0132 07/12/21 0339 07/13/21 0016  WBC 2.9* 2.3*  --   NEUTROABS 1.9  --   --   HGB 7.7* 6.1* 9.0*  HCT 24.1* 19.0* 26.5*  MCV 104.8* 103.8*  --   PLT 120* 112*  --    Recent Labs    07/11/21 0132 07/12/21 0339  LABPROT 17.5* 18.5*  INR 1.4* 1.5*       Assessment Cirrhosis WBC 2.3 HGB 9.0 (6.1 yesterday) MCV 103.8 Platelets 112 AST 38 ALT 21 Alkphos 58 TBili 1.8 GFR 54  INR 1.5 MELD-Na 18, MELD 15 - Iron 105 - US abdomen 11/27: Cirrrhosis with evidence of portal hypertension, splenomegaly, and ascites. Patent main portal vein with hepatopetal flow.   Discitis and osteomyelitis   Hepatitis C   AKI   Polysubstance abuse - Normally on suboxone  Plan: IR was unable to complete paracentesis today due to not having enough fluid to safely access. Continue Lactulose, titrate to 2-3Bms/day Continue supportive care [facility] will follow.  Kanesha Cadle Leanna Sato PA-C 07/13/2021, 10:13 AM  Contact #  [phone]

## 2021-07-13 NOTE — Procedures (Signed)
Pt was brought to [place] for paracentesis. Upon US examination, there was no fluid on R and a very small fluid pocket onL that was not large enough to safely access.  Exam was ended and explained to patient.  Patient was in agreement with findings.     Alex Gardener, AGNP-BC 07/13/2021, 9:44 AM

## 2021-07-13 NOTE — Progress Notes (Signed)
Patient noted to only have 150 mL output today. Bladder scan performed and noted to be 168. MD notified and advised to hold off on in and out catheter.

## 2021-07-14 ENCOUNTER — Inpatient Hospital Stay (HOSPITAL_COMMUNITY): Payer: Self-pay

## 2021-07-14 DIAGNOSIS — R140 Abdominal distension (gaseous): Secondary | ICD-10-CM

## 2021-07-14 LAB — CBC WITH DIFFERENTIAL/PLATELET
Abs Immature Granulocytes: 0.02 10*3/uL (ref 0.00–0.07)
Basophils Absolute: 0 10*3/uL (ref 0.0–0.1)
Basophils Relative: 1 %
Eosinophils Absolute: 0.1 10*3/uL (ref 0.0–0.5)
Eosinophils Relative: 3 %
HCT: 25 % — ABNORMAL LOW (ref 36.0–46.0)
Hemoglobin: 8.3 g/dL — ABNORMAL LOW (ref 12.0–15.0)
Immature Granulocytes: 1 %
Lymphocytes Relative: 23 %
Lymphs Abs: 0.7 10*3/uL (ref 0.7–4.0)
MCH: 32.4 pg (ref 26.0–34.0)
MCHC: 33.2 g/dL (ref 30.0–36.0)
MCV: 97.7 fL (ref 80.0–100.0)
Monocytes Absolute: 0.4 10*3/uL (ref 0.1–1.0)
Monocytes Relative: 12 %
Neutro Abs: 1.8 10*3/uL (ref 1.7–7.7)
Neutrophils Relative %: 60 %
Platelets: 135 10*3/uL — ABNORMAL LOW (ref 150–400)
RBC: 2.56 MIL/uL — ABNORMAL LOW (ref 3.87–5.11)
RDW: 20.3 % — ABNORMAL HIGH (ref 11.5–15.5)
WBC: 3 10*3/uL — ABNORMAL LOW (ref 4.0–10.5)
nRBC: 0 % (ref 0.0–0.2)

## 2021-07-14 LAB — MAGNESIUM: Magnesium: 2.2 mg/dL (ref 1.7–2.4)

## 2021-07-14 LAB — COMPREHENSIVE METABOLIC PANEL
ALT: 21 U/L (ref 0–44)
AST: 43 U/L — ABNORMAL HIGH (ref 15–41)
Albumin: 2.1 g/dL — ABNORMAL LOW (ref 3.5–5.0)
Alkaline Phosphatase: 62 U/L (ref 38–126)
Anion gap: 5 (ref 5–15)
BUN: 22 mg/dL — ABNORMAL HIGH (ref 6–20)
CO2: 22 mmol/L (ref 22–32)
Calcium: 8 mg/dL — ABNORMAL LOW (ref 8.9–10.3)
Chloride: 104 mmol/L (ref 98–111)
Creatinine, Ser: 0.94 mg/dL (ref 0.44–1.00)
GFR, Estimated: >60 mL/min (ref >60)
Glucose, Bld: 108 mg/dL — ABNORMAL HIGH (ref 70–99)
Potassium: 4.3 mmol/L (ref 3.5–5.1)
Sodium: 131 mmol/L — ABNORMAL LOW (ref 135–145)
Total Bilirubin: 2 mg/dL — ABNORMAL HIGH (ref 0.3–1.2)
Total Protein: 7.5 g/dL (ref 6.5–8.1)

## 2021-07-14 IMAGING — DX DG ABDOMEN 1V
1 series · 1 of 1 positions shown · non-contrast
Comparison: None.

CLINICAL DATA: Abdominal pain and distension

EXAM:
ABDOMEN - 1 VIEW

[abdomen kub]
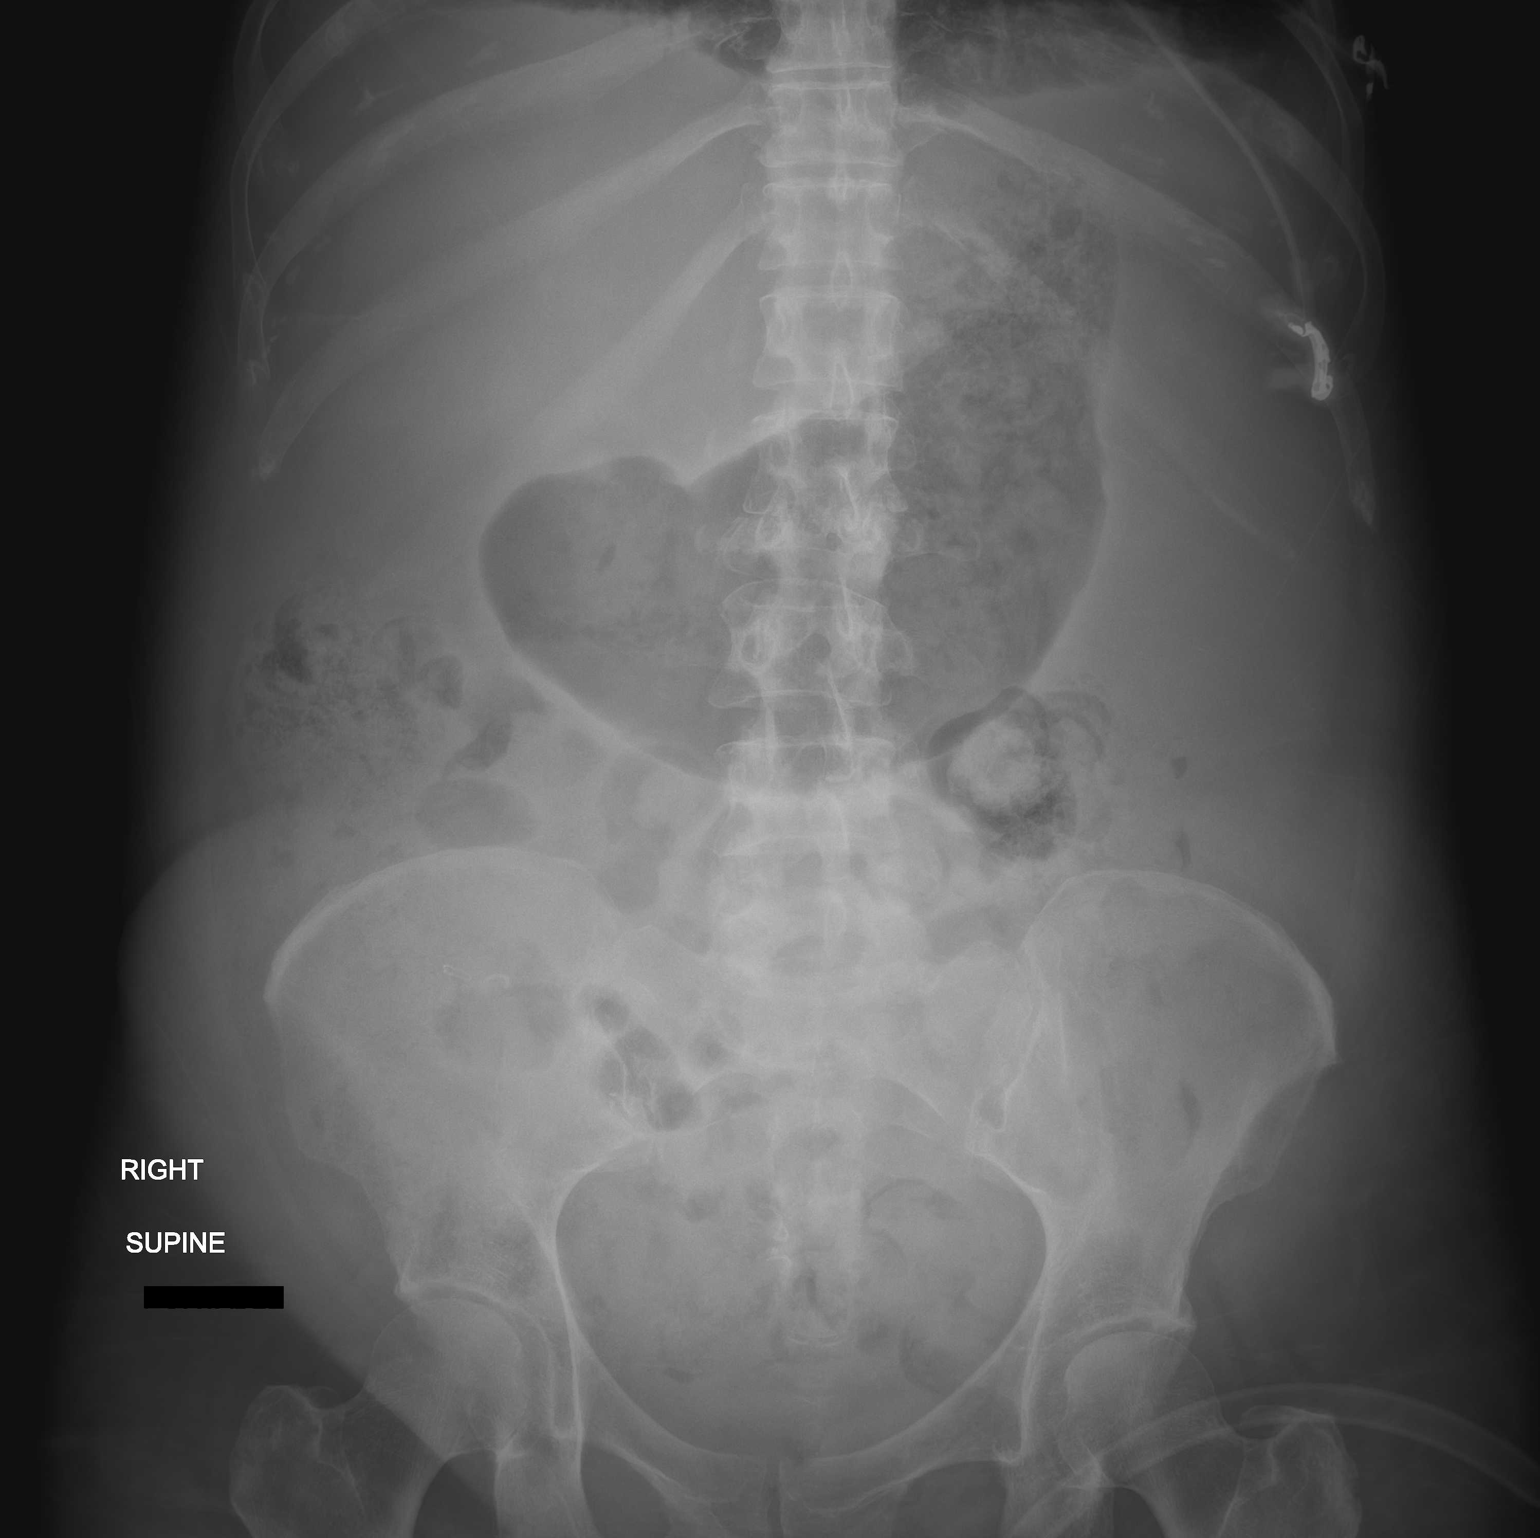

[1 of 1 positions shown; findings below may reference images not displayed]

FINDINGS: Mild amount of retained stool in the colon. Nonobstructive bowel gas
pattern. Stomach mildly distended. No urinary tract calcifications.
No acute skeletal abnormality.
IMPRESSION: Nonobstructive bowel gas pattern. Mild amount of stool in the colon.
Mildly distended stomach.

## 2021-07-14 MED ORDER — SODIUM CHLORIDE 0.9 % IV SOLN
INTRAVENOUS | Status: DC | PRN
  Administered 2021-07-14: 10 mL/h via INTRAVENOUS

## 2021-07-14 MED ORDER — ALUM & MAG HYDROXIDE-SIMETH 200-200-20 MG/5ML PO SUSP
30.0000 mL | ORAL | Status: DC | PRN
  Administered 2021-07-15 – 2021-07-17 (×4): 30 mL via ORAL
  Filled 2021-07-14 (×5): qty 30

## 2021-07-14 NOTE — Progress Notes (Signed)
PROGRESS NOTE    Christina Wyatt  QMV:[id number] DOB: 12/28/1965 DOA: 07/10/2021 PCP: Georgann Housekeeper, MD    Chief Complaint  Patient presents with   Back Pain    Lower Back x4 hrs    Brief Narrative:   Patient 55 year old female history of hep C, polysubstance abuse, bipolar disorder, cirrhosis, who was admitted to [facility] on 11/5 with worsening abdominal pain, lower extremity swelling, decreased appetite, worsening chronic back pain with subjective fevers and chills as well as shortness of breath and tachypnea.  Patient admitted at [facility] hospital with concerns for sepsis noted to have a bacteremia which grew MRSA, patient underwent MRI of the C-spine which showed discitis and osteomyelitis at C5, C6-C7, epidural thickening from C5-C6 disc space down to near C7 and T1 concerning for epidural abscess.  Patient underwent a 2D echo, TEE negative for endocarditis.  Admission at that time complicated with a thrombosed basilic vein on the left as well as phlebitis on the right with some purulence.  Patient also noted to have severe cirrhosis of the liver presumably from hep C and alcohol abuse.   ID consulted patient placed on IV vancomycin initially and changed back to IV daptomycin per ID recommendations.  Patient seen by neurosurgery and feel no surgical intervention needed at this time.  GI consulted to help optimize cirrhosis.  Palliative care consultation pending        Assessment & Plan:   Principal Problem:   Diskitis Active Problems:   IVDU (intravenous drug user)   Chronic hepatitis C (HCC)   MRSA bacteremia   Other cirrhosis of liver (HCC)   Anemia   Ascites of liver   AKI (acute kidney injury) (HCC)   Pancytopenia (HCC)   Cirrhosis of liver with ascites (HCC)   Osteomyelitis of cervical spine (HCC)   Protein-calorie malnutrition, severe   C6- C7 discitis/ osteomyelitis/ New L1 L2 discitis with pathological fracture, paraspinal epidural  inflammatory changes.  With probable abscess of the right psoas muscle.  Sepsis ruled out.  ID on board and appreciate recommendations.  NS contacted and no intervention needed at this time.    MRSA Bacteremia:  No vegetation on echocardiogram.  Resume IV daptomycin.  ID following.    Cirrhosis with ascites / Hepatitis C/ Hyperbilirubinemia:  - abd persistently distended.  - US paracentesis ordered and attempted without any success.  Continue with lactulose twice daily, xifaxan, lasix, spironolactone.  GI on board and appreciate recommendations.    IV drug abuse and alcohol abuse.  Resume pain meds as needed.  Plan to wean her off narcotics as tolerated.    AKI:  Improving.    Anemia:  S/p 2 units of prbc transfusion.  Anemia of chronic disease;  Transfuse to keep hemoglobin greater than 7.    DVT prophylaxis: SCD'S Code Status: (DNR) Family Communication: none at bedside.  Disposition:   Status is: Inpatient  Remains inpatient appropriate because: IV antibiotics.        Consultants:  Gastroenterology.   Procedures: none.   Antimicrobials:  Antibiotics Given (last 72 hours)     Date/Time Action Medication Dose Rate   07/12/21 2012 Given   rifaximin (XIFAXAN) tablet 550 mg 550 mg    07/12/21 2139 New Bag/Given   DAPTOmycin (CUBICIN) 500 mg in sodium chloride 0.9 % IVPB 500 mg 120 mL/hr   07/13/21 1000 Given   rifaximin (XIFAXAN) tablet 550 mg 550 mg    07/13/21 1949 New Bag/Given  DAPTOmycin (CUBICIN) 500 mg in sodium chloride 0.9 % IVPB 500 mg 120 mL/hr   07/13/21 2248 Given   rifaximin (XIFAXAN) tablet 550 mg 550 mg    07/14/21 1008 Given   rifaximin (XIFAXAN) tablet 550 mg 550 mg          Subjective: BM last night, no new complaints.   Objective: Vitals:   07/13/21 1325 07/13/21 2031 07/14/21 0446 07/14/21 1245  BP: 132/82 131/88 112/86 121/82  Pulse: 81 95 98 89  Resp: 15 18 18 18   Temp: 99.5 F (37.5 C) 99.3 F (37.4 C) (!)  97.4 F (36.3 C) 97.9 F (36.6 C)  TempSrc: Oral Oral Oral Oral  SpO2: 95% 93% 96% 100%  Weight:      Height:        Intake/Output Summary (Last 24 hours) at 07/14/2021 1426 Last data filed at 07/13/2021 2215 Gross per 24 hour  Intake 540 ml  Output 270 ml  Net 270 ml   Filed Weights   07/11/21 0005  Weight: 52.2 kg    Examination:  General exam: Appears calm and comfortable  Respiratory system: Clear to auscultation. Respiratory effort normal. Cardiovascular system: S1 & S2 heard, RRR. No JVD, No pedal edema. Gastrointestinal system: Abdomen is firm, distended, bowel sounds wnl.  Central nervous system: Alert and oriented. No focal neurological deficits. Extremities: Symmetric 5 x 5 power. Skin: No rashes, lesions or ulcers Psychiatry: Mood & affect appropriate.     Data Reviewed: I have personally reviewed following labs and imaging studies  CBC: Recent Labs  Lab 07/11/21 0132 07/12/21 0339 07/13/21 0016 07/14/21 0523  WBC 2.9* 2.3*  --  3.0*  NEUTROABS 1.9  --   --  1.8  HGB 7.7* 6.1* 9.0* 8.3*  HCT 24.1* 19.0* 26.5* 25.0*  MCV 104.8* 103.8*  --  97.7  PLT 120* 112*  --  135*    Basic Metabolic Panel: Recent Labs  Lab 07/11/21 0132 07/12/21 0339 07/13/21 0402 07/14/21 0523  NA 133* 133*  --  131*  K 4.3 4.1  --  4.3  CL 106 108  --  104  CO2 20* 19*  --  22  GLUCOSE 107* 139*  --  108*  BUN 32* 23*  --  22*  CREATININE 1.58* 1.12* 1.17* 0.94  CALCIUM 8.3* 7.9*  --  8.0*  MG  --   --   --  2.2    GFR: Estimated Creatinine Clearance: 49.8 mL/min (by C-G formula based on SCr of 0.94 mg/dL).  Liver Function Tests: Recent Labs  Lab 07/11/21 0132 07/12/21 0339 07/14/21 0523  AST 51* 38 43*  ALT 25 21 21   ALKPHOS 77 58 62  BILITOT 2.4* 1.8* 2.0*  PROT 8.7* 7.3 7.5  ALBUMIN 2.4* 2.1* 2.1*    CBG: No results for input(s): GLUCAP in the last 168 hours.   Recent Results (from the past 240 hour(s))  Blood Culture (routine x 2)      Status: None (Preliminary result)   Collection Time: 07/11/21  1:32 AM   Specimen: BLOOD  Result Value Ref Range Status   Specimen Description   Final    BLOOD BLOOD LEFT FOREARM Performed at [facility], [address], [place], [place]    Special Requests   Final    BOTTLES DRAWN AEROBIC AND ANAEROBIC Blood Culture results may not be optimal due to an inadequate volume of blood received in culture bottles Performed at [facility], [address] [place], [place] [address]    Culture   Final    NO GROWTH 3 DAYS Performed at [facility], [address], [place], [place] [address]    Report Status PENDING  Incomplete  Blood Culture (routine x 2)     Status: None (Preliminary result)   Collection Time: 07/11/21  1:34 AM   Specimen: BLOOD  Result Value Ref Range Status   Specimen Description   Final    BLOOD BLOOD RIGHT FOREARM Performed at [facility], [address], [place], [place] [address]    Special Requests   Final    BOTTLES DRAWN AEROBIC AND ANAEROBIC Blood Culture adequate volume Performed at [facility], [address] [place], [place] [address]    Culture   Final    NO GROWTH 3 DAYS Performed at [facility], [address], [place], [place] [address]    Report Status PENDING  Incomplete  Resp Panel by RT-PCR (Flu A&B, Covid) Nasopharyngeal Swab     Status: None   Collection Time: 07/11/21 12:47 PM   Specimen: Nasopharyngeal Swab; Nasopharyngeal(NP) swabs in vial transport medium  Result Value Ref Range Status   SARS Coronavirus 2 by RT PCR NEGATIVE NEGATIVE Final    Comment: (NOTE) SARS-CoV-2 target nucleic acids are NOT DETECTED.  The SARS-CoV-2 RNA is generally detectable in upper respiratory specimens during the acute phase of infection. The lowest concentration of SARS-CoV-2 viral copies this assay can detect is 138 copies/mL. A negative result does not  preclude SARS-Cov-2 infection and should not be used as the sole basis for treatment or other patient management decisions. A negative result may occur with  improper specimen collection/handling, submission of specimen other than nasopharyngeal swab, presence of viral mutation(s) within the areas targeted by this assay, and inadequate number of viral copies(<138 copies/mL). A negative result must be combined with clinical observations, patient history, and epidemiological information. The expected result is Negative.  Fact Sheet for Patients:  BloggerCourse.com  Fact Sheet for Healthcare Providers:  SeriousBroker.it  This test is no t yet approved or cleared by the [place] FDA and  has been authorized for detection and/or diagnosis of SARS-CoV-2 by FDA under an Emergency Use Authorization (EUA). This EUA will remain  in effect (meaning this test can be used) for the duration of the COVID-19 declaration under Section 564(b)(1) of the Act, 21 U.S.C.section 360bbb-3(b)(1), unless the authorization is terminated  or revoked sooner.       Influenza A by PCR NEGATIVE NEGATIVE Final   Influenza B by PCR NEGATIVE NEGATIVE Final    Comment: (NOTE) The Xpert Xpress SARS-CoV-2/FLU/RSV plus assay is intended as an aid in the diagnosis of influenza from Nasopharyngeal swab specimens and should not be used as a sole basis for treatment. Nasal washings and aspirates are unacceptable for Xpert Xpress SARS-CoV-2/FLU/RSV testing.  Fact Sheet for Patients: BloggerCourse.com  Fact Sheet for Healthcare Providers: SeriousBroker.it  This test is not yet approved or cleared by the [place] FDA and has been authorized for detection and/or diagnosis of SARS-CoV-2 by FDA under an Emergency Use Authorization (EUA). This EUA will remain in effect (meaning this test can be used) for the  duration of the COVID-19 declaration under Section 564(b)(1) of the Act, 21 U.S.C. section 360bbb-3(b)(1), unless the authorization is terminated or revoked.  Performed at [facility], [address] [place], [place] [address]  Radiology Studies: [place] ASCITES (ABDOMEN LIMITED)  Result Date: 07/13/2021 CLINICAL DATA:  Liver failure, cirrhosis, ascites EXAM: LIMITED ABDOMEN ULTRASOUND FOR ASCITES TECHNIQUE: Limited ultrasound survey for ascites was performed in all four abdominal quadrants. COMPARISON:  07/11/2021 FINDINGS: Small volume scattered abdominal ascites. No dominant large pocket. IMPRESSION: Small volume abdominal ascites.  Paracentesis deferred. Electronically Signed   By: Corlis Leak M.D.   On: 07/13/2021 10:06        Scheduled Meds:  (feeding supplement) PROSource Plus  30 mL Oral TID   buprenorphine-naloxone  2 tablet Sublingual BID   Chlorhexidine Gluconate Cloth  6 each Topical Daily   furosemide  40 mg Oral Daily   gabapentin  200 mg Oral TID    HYDROmorphone (DILAUDID) injection  0.5 mg Intravenous Once   lactulose  20 g Oral BID   methocarbamol  500 mg Oral TID   multivitamin with minerals  1 tablet Oral Daily   rifaximin  550 mg Oral BID   sodium chloride flush  10-40 mL Intracatheter Q12H   spironolactone  100 mg Oral Daily   Continuous Infusions:  albumin human Stopped (07/13/21 1847)   DAPTOmycin (CUBICIN)  IV Stopped (07/13/21 2019)     LOS: 3 days        Kathlen Mody, MD [facility]   To contact the attending provider between 7A-7P or the covering provider during after hours 7P-7A, please log into the web site [email or web address] and access using universal [place] password for that web site. If you do not have the password, please call the hospital operator.  07/14/2021, 2:26 PM

## 2021-07-14 NOTE — Evaluation (Signed)
Occupational Therapy Evaluation Patient Details Name: Christina Wyatt MRN: [id number] DOB: 1966-06-21 Today's Date: 07/14/2021   History of Present Illness 55 y.o. female with medical history significant of Hepatitis C, bipolar d/o; polysubstance abuse. Presented w/ back pain and stomach pain.  Per ID: MRSA bacteremia, vertebral osteomyelitis with early ventral epidural abscess.   Clinical Impression   Patient admitted for the diagnosis above.  PTA she was at a local SNF, patient stating she was no longer receiving skilled OT/PT, but remained for IV antibiotics.  Patient was completing her own ADL and walking the halls with RW at Mod I level.  Deficits impacting independence are listed below.  Currently she is needing generalized supervision for in room mobility/toileting and up to Mod A for lower body ADL.  Patient is not sure about her discharge plan, she has spoken to her son regarding going home to live with him.  OT to continue efforts in the acute setting to maximize her functional status.       Recommendations for follow up therapy are one component of a multi-disciplinary discharge planning process, led by the attending physician.  Recommendations may be updated based on patient status, additional functional criteria and insurance authorization.   Follow Up Recommendations  Home health OT    Assistance Recommended at Discharge Set up Supervision/Assistance  Functional Status Assessment  Patient has had a recent decline in their functional status and demonstrates the ability to make significant improvements in function in a reasonable and predictable amount of time.  Equipment Recommendations  BSC/3in1;Tub/shower seat    Recommendations for Other Services       Precautions / Restrictions Precautions Precautions: Back Precaution Booklet Issued: No Precaution Comments: Reviewed log rolling Restrictions Weight Bearing Restrictions: No      Mobility Bed Mobility Overal bed  mobility: Needs Assistance Bed Mobility: Sidelying to Sit   Sidelying to sit: Supervision;HOB elevated       General bed mobility comments: Increased time    Transfers Overall transfer level: Needs assistance Equipment used: Rolling walker (2 wheels) Transfers: Sit to/from Stand Sit to Stand: Supervision           General transfer comment: cues to push from bed, not pull from the RW      Balance Overall balance assessment: Needs assistance Sitting-balance support: Feet supported Sitting balance-Leahy Scale: Good     Standing balance support: Reliant on assistive device for balance Standing balance-Leahy Scale: Poor Standing balance comment: needing RW for support                           ADL either performed or assessed with clinical judgement   ADL       Grooming: Wash/dry hands;Wash/dry face;Set up;Sitting       Lower Body Bathing: Moderate assistance;Sit to/from stand       Lower Body Dressing: Moderate assistance;Sit to/from stand Lower Body Dressing Details (indicate cue type and reason): unable to reach feet, but can figure four sit. Toilet Transfer: Supervision/safety;Rolling walker (2 wheels);Regular Toilet   Toileting- Clothing Manipulation and Hygiene: Supervision/safety;Sit to/from stand         General ADL Comments: Mild dizziness noted with mobility.     Vision Patient Visual Report: No change from baseline       Perception Perception Perception: Not tested   Praxis Praxis Praxis: Not tested    Pertinent Vitals/Pain Pain Assessment: Faces Faces Pain Scale: Hurts even more Pain Location: Low back Pain  Descriptors / Indicators: Throbbing;Sharp Pain Intervention(s): Monitored during session     Hand Dominance Right   Extremity/Trunk Assessment Upper Extremity Assessment Upper Extremity Assessment: Overall WFL for tasks assessed   Lower Extremity Assessment Lower Extremity Assessment: Defer to PT evaluation    Cervical / Trunk Assessment Cervical / Trunk Assessment: Kyphotic   Communication Communication Communication: No difficulties   Cognition Arousal/Alertness: Awake/alert Behavior During Therapy: WFL for tasks assessed/performed Overall Cognitive Status: Within Functional Limits for tasks assessed                                       General Comments   Mild dizziness noted with in room mobility    Exercises     Shoulder Instructions      Home Living Family/patient expects to be discharged to:: Private residence Living Arrangements: Children Available Help at Discharge: Family;Available PRN/intermittently Type of Home: House                           Additional Comments: Patient has never been to her sons's home, and does not know the lay out of the home.  Son will need to be contacted.  Her plan is to discharge from the hospital to the son's home, patient states she no longer has her own home.      Prior Functioning/Environment Prior Level of Function : Independent/Modified Independent             Mobility Comments: Patient was walking the halls of SNF Mod I with RW ADLs Comments: Was able to complete her own ADL at sit/stand level at the SNF until recent onset of back pain.  Currnelty unable to reach her feet.        OT Problem List: Decreased strength;Decreased activity tolerance;Impaired balance (sitting and/or standing);Decreased knowledge of use of DME or AE;Pain      OT Treatment/Interventions: Self-care/ADL training;Therapeutic exercise;Therapeutic activities;Patient/family education;DME and/or AE instruction;Balance training    OT Goals(Current goals can be found in the care plan section) Acute Rehab OT Goals Patient Stated Goal: Return home with the son OT Goal Formulation: With patient Time For Goal Achievement: 07/28/21 Potential to Achieve Goals: Good ADL Goals Pt Will Perform Grooming: with set-up;standing Pt Will  Perform Lower Body Bathing: with set-up;sit to/from stand Pt Will Perform Upper Body Dressing: with set-up;with adaptive equipment Pt Will Perform Lower Body Dressing: with set-up;sit to/from stand Pt Will Transfer to Toilet: with modified independence;ambulating;regular height toilet Pt Will Perform Toileting - Clothing Manipulation and hygiene: Independently;sit to/from stand Pt/caregiver will Perform Home Exercise Program: Increased strength;With theraband;With written HEP provided  OT Frequency: Min 2X/week   Barriers to D/C:    None noted       Co-evaluation              AM-PAC OT "6 Clicks" Daily Activity     Outcome Measure Help from another person eating meals?: None Help from another person taking care of personal grooming?: None Help from another person toileting, which includes using toliet, bedpan, or urinal?: A Little Help from another person bathing (including washing, rinsing, drying)?: A Lot Help from another person to put on and taking off regular upper body clothing?: None Help from another person to put on and taking off regular lower body clothing?: A Lot 6 Click Score: 19   End of Session Equipment Utilized During Treatment:  Rolling walker (2 wheels) Nurse Communication: Mobility status  Activity Tolerance: Patient limited by pain Patient left: in chair;with call bell/phone within reach  OT Visit Diagnosis: Unsteadiness on feet (R26.81);Pain;Dizziness and giddiness (R42)                Time: 8592-9244 OT Time Calculation (min): 23 min Charges:  OT General Charges $OT Visit: 1 Visit OT Evaluation $OT Eval Moderate Complexity: 1 Mod OT Treatments $Self Care/Home Management : 8-22 mins  07/14/2021  RP, OTR/L  Acute Rehabilitation Services  Office:  [phone]   Suzanna Obey 07/14/2021, 8:50 AM

## 2021-07-14 NOTE — Progress Notes (Signed)
[facility] Gastroenterology Progress Note  Christina Wyatt 55 y.o. Dec 23, 1965  CC:  Cirrhosis   Subjective: Patient states she is tolerating her diet well. Denies nausea/vomiting. Had a "good" BM yesterday. Reports intermittent lower abdominal pain. Denies melena/hematochezia.  ROS : Review of Systems  Cardiovascular:  Negative for chest pain and palpitations.  Gastrointestinal:  Positive for abdominal pain. Negative for blood in stool, constipation, diarrhea, heartburn, melena, nausea and vomiting.     Objective: Vital signs in last 24 hours: Vitals:   07/13/21 2031 07/14/21 0446  BP: 131/88 112/86  Pulse: 95 98  Resp: 18 18  Temp: 99.3 F (37.4 C) (!) 97.4 F (36.3 C)  SpO2: 93% 96%    Physical Exam:  General:  Alert, cooperative, no distress, appears stated age  Head:  Normocephalic, without obvious abnormality, atraumatic  Eyes:  Anicteric sclera, EOM's intact, conjunctival pallor  Lungs:   Clear to auscultation bilaterally, respirations unlabored  Heart:  Regular rate and rhythm, S1, S2 normal  Abdomen:   Tight/distended/protuberant abdomen. Nontender to palpation. bowel sounds soft all four quadrants    Lab Results: Recent Labs    07/12/21 0339 07/13/21 0402 07/14/21 0523  NA 133*  --  131*  K 4.1  --  4.3  CL 108  --  104  CO2 19*  --  22  GLUCOSE 139*  --  108*  BUN 23*  --  22*  CREATININE 1.12* 1.17* 0.94  CALCIUM 7.9*  --  8.0*  MG  --   --  2.2   Recent Labs    07/12/21 0339 07/14/21 0523  AST 38 43*  ALT 21 21  ALKPHOS 58 62  BILITOT 1.8* 2.0*  PROT 7.3 7.5  ALBUMIN 2.1* 2.1*   Recent Labs    07/12/21 0339 07/13/21 0016 07/14/21 0523  WBC 2.3*  --  3.0*  NEUTROABS  --   --  1.8  HGB 6.1* 9.0* 8.3*  HCT 19.0* 26.5* 25.0*  MCV 103.8*  --  97.7  PLT 112*  --  135*   Recent Labs    07/12/21 0339  LABPROT 18.5*  INR 1.5*      Assessment Cirrhosis; likely secondary to chronic hep C WBC 3.0 HGB 8.3 (9.0 yesterday) MCV 97.7  Platelets 135 (trending up) AST 43 ALT 21 Alkphos 62 TBili 2.0 GFR >60  INR 1.5 - US abdomen 11/27: Cirrrhosis with evidence of portal hypertension, splenomegaly, and ascites. Patent main portal vein with hepatopetal flow. - IR unable to complete diagnostic and therapeutic ultrasound-guided paracentesis due to very small pocket of fluid that was not enough to safely tap.  Discitis and osteomyelitis   Hepatitis C   AKI   Polysubstance abuse  Plan: Continue lactulose, titrate to 2-3BMs/day Continue lasix and spironolactone Continue xifaxan Continue supportive care [facility] will follow.   Legrand Como PA-C 07/14/2021, 8:57 AM  Contact #  [phone]

## 2021-07-14 NOTE — Evaluation (Signed)
Physical Therapy Evaluation Patient Details Name: Christina Wyatt MRN: [id number] DOB: 1966/06/04 Today's Date: 07/14/2021  History of Present Illness  54 y.o. female with medical history significant of Hepatitis C, bipolar d/o; polysubstance abuse. Presented w/ back pain and stomach pain.  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 H/O osteo/discitis C 5-6  Clinical Impression  Patient reporting 8/10 back pain. Patient ambulated x 20' x2 with RW. Patient with noted flexed trunk. Patient has had a  decline in functional independence, reports ambulating without RW at SNF recently until new back pain started. Plans to return to SNF For PT and antibiotics.Pt admitted with above diagnosis.  Pt currently with functional limitations due to the deficits listed below (see PT Problem List). Pt will benefit from skilled PT to increase their independence and safety with mobility to allow discharge to the venue listed below.        Recommendations for follow up therapy are one component of a multi-disciplinary discharge planning process, led by the attending physician.  Recommendations may be updated based on patient status, additional functional criteria and insurance authorization.  Follow Up Recommendations Skilled nursing-short term rehab (<3 hours/day)    Assistance Recommended at Discharge    Functional Status Assessment Patient has had a recent decline in their functional status and demonstrates the ability to make significant improvements in function in a reasonable and predictable amount of time.  Equipment Recommendations  None recommended by PT    Recommendations for Other Services       Precautions / Restrictions Precautions Precautions: Back Precaution Comments: Reviewed log rolling, no bend or twist      Mobility  Bed Mobility Overal bed mobility: Needs Assistance Bed Mobility: Sidelying to Sit;Rolling;Sit  to Supine Rolling: Supervision Sidelying to sit: Supervision;HOB elevated       General bed mobility comments: Increased time    Transfers Overall transfer level: Needs assistance Equipment used: Rolling walker (2 wheels) Transfers: Sit to/from Stand Sit to Stand: Supervision           General transfer comment: cues to push from bed, not pull from the RW    Ambulation/Gait Ambulation/Gait assistance: Min assist;Min guard Gait Distance (Feet): 20 Feet (x s) Assistive device: Rolling walker (2 wheels) Gait Pattern/deviations: Step-to pattern;Step-through pattern;Staggering left;Staggering right;Trunk flexed Gait velocity: decr     General Gait Details: intermittent stops with increased back pain reports  Stairs            Wheelchair Mobility    Modified Rankin (Stroke Patients Only)       Balance Overall balance assessment: Needs assistance Sitting-balance support: Feet supported Sitting balance-Leahy Scale: Good Sitting balance - Comments: reliant on UE support for back protection   Standing balance support: Reliant on assistive device for balance;During functional activity Standing balance-Leahy Scale: Poor Standing balance comment: needing RW for support                             Pertinent Vitals/Pain Pain Score: 8  Pain Location: Low back Pain Descriptors / Indicators: Throbbing;Sharp Pain Intervention(s): Monitored during session;Premedicated before session;Limited activity within patient's tolerance    Home Living Family/patient expects to be discharged to:: Skilled nursing facility                   Additional Comments: plan's to return to [facility] for IV  antibx and therapy    Prior Function  Hand Dominance        Extremity/Trunk Assessment   Upper Extremity Assessment Upper Extremity Assessment: Overall WFL for tasks assessed    Lower Extremity Assessment Lower Extremity  Assessment: Generalized weakness    Cervical / Trunk Assessment Cervical / Trunk Assessment: Kyphotic  Communication      Cognition Arousal/Alertness: Awake/alert Behavior During Therapy: WFL for tasks assessed/performed Overall Cognitive Status: Within Functional Limits for tasks assessed                                          General Comments      Exercises     Assessment/Plan    PT Assessment Patient needs continued PT services  PT Problem List Decreased strength;Decreased knowledge of precautions;Decreased mobility;Decreased activity tolerance;Pain;Decreased safety awareness       PT Treatment Interventions DME instruction;Therapeutic activities;Gait training;Therapeutic exercise;Functional mobility training    PT Goals (Current goals can be found in the Care Plan section)  Acute Rehab PT Goals Patient Stated Goal: to be able to walk without pain PT Goal Formulation: With patient Time For Goal Achievement: 07/28/21 Potential to Achieve Goals: Fair    Frequency Min 2X/week   Barriers to discharge        Co-evaluation               AM-PAC PT "6 Clicks" Mobility  Outcome Measure Help needed turning from your back to your side while in a flat bed without using bedrails?: A Little Help needed moving from lying on your back to sitting on the side of a flat bed without using bedrails?: A Little Help needed moving to and from a bed to a chair (including a wheelchair)?: A Little Help needed standing up from a chair using your arms (e.g., wheelchair or bedside chair)?: A Little Help needed to walk in hospital room?: A Lot Help needed climbing 3-5 steps with a railing? : A Lot 6 Click Score: 16    End of Session   Activity Tolerance: Patient limited by pain Patient left: in bed;with call bell/phone within reach;with bed alarm set Nurse Communication: Mobility status PT Visit Diagnosis: Difficulty in walking, not elsewhere classified  (R26.2);Pain    Time: 3546-5681 PT Time Calculation (min) (ACUTE ONLY): 28 min   Charges:   PT Evaluation $PT Eval Low Complexity: 1 Low PT Treatments $Gait Training: 8-22 mins        Blanchard Kelch PT Acute Rehabilitation Services Pager [phone] Office [phone]   Rada Hay 07/14/2021, 1:09 PM

## 2021-07-15 LAB — BASIC METABOLIC PANEL
Anion gap: 5 (ref 5–15)
BUN: 21 mg/dL — ABNORMAL HIGH (ref 6–20)
CO2: 23 mmol/L (ref 22–32)
Calcium: 7.5 mg/dL — ABNORMAL LOW (ref 8.9–10.3)
Chloride: 100 mmol/L (ref 98–111)
Creatinine, Ser: 0.87 mg/dL (ref 0.44–1.00)
GFR, Estimated: >60 mL/min (ref >60)
Glucose, Bld: 119 mg/dL — ABNORMAL HIGH (ref 70–99)
Potassium: 3.9 mmol/L (ref 3.5–5.1)
Sodium: 128 mmol/L — ABNORMAL LOW (ref 135–145)

## 2021-07-15 MED ORDER — LACTULOSE 10 GM/15ML PO SOLN
20.0000 g | Freq: Three times a day (TID) | ORAL | Status: DC
  Administered 2021-07-16 – 2021-07-19 (×11): 20 g via ORAL
  Filled 2021-07-15 (×12): qty 30

## 2021-07-15 MED ORDER — SPIRONOLACTONE 25 MG PO TABS
150.0000 mg | ORAL_TABLET | Freq: Every day | ORAL | Status: DC
  Administered 2021-07-16: 150 mg via ORAL
  Filled 2021-07-15: qty 2

## 2021-07-15 MED ORDER — FUROSEMIDE 40 MG PO TABS
60.0000 mg | ORAL_TABLET | Freq: Every day | ORAL | Status: DC
  Administered 2021-07-16 – 2021-07-19 (×4): 60 mg via ORAL
  Filled 2021-07-15 (×4): qty 1

## 2021-07-15 NOTE — Consult Note (Signed)
Palliative Care consulted for pain and symptom management. 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 She has DNR paperwork accompanying her from SNF that was completed at [facility]. Her complex addition is outside of the scope of palliative care and she is still pursuing aggressive and active treatment for all related illness. We were consulted for symptom management but are unable to follow her in the community or at SNF following discharge. Suboxone has limited contribution to pain relief -the naloxone component will not reduce the effects of oral opioids for acute pain relief,but will reduce effectiveness of IV opioids. For her acute pain, continue suboxone at her regular 8:2 dose and use only oral PRN pain medication for uncontrolled pain. Start with oxycodone IR 5-10 mg q6 hours PRN. There is no need for long acting opioid dosing while on suboxone. Recommend palliative care follow her at SNF for ongoing goals of care discussions and support given her significant disease burden. She would be extremely high risk for relapse in the community outside of a structured LTC or Skilled facility. Will sign off at this time. Please re consult if her condition deteriorates or her goals of care change.  Anderson Malta, DO Palliative Medicine

## 2021-07-15 NOTE — Progress Notes (Signed)
PROGRESS NOTE    Christina Wyatt  WUJ:[id number] DOB: 1966-03-02 DOA: 07/10/2021 PCP: Georgann Housekeeper, MD    Chief Complaint  Patient presents with   Back Pain    Lower Back x4 hrs    Brief Narrative:   Patient 55 year old female history of hep C, polysubstance abuse, bipolar disorder, cirrhosis, who was admitted to [facility] on 11/5 with worsening abdominal pain, lower extremity swelling, decreased appetite, worsening chronic back pain with subjective fevers and chills as well as shortness of breath and tachypnea.  Patient admitted at [facility] hospital with concerns for sepsis noted to have a bacteremia which grew MRSA, patient underwent MRI of the C-spine which showed discitis and osteomyelitis at C5, C6-C7, epidural thickening from C5-C6 disc space down to near C7 and T1 concerning for epidural abscess.  Patient underwent a 2D echo, TEE negative for endocarditis.  Admission at that time complicated with a thrombosed basilic vein on the left as well as phlebitis on the right with some purulence.  Patient also noted to have severe cirrhosis of the liver presumably from hep C and alcohol abuse.   ID consulted patient placed on IV vancomycin initially and changed back to IV daptomycin per ID recommendations.  Patient seen by neurosurgery and feel no surgical intervention needed at this time.  GI consulted to help optimize cirrhosis.          Assessment & Plan:   Principal Problem:   Diskitis Active Problems:   IVDU (intravenous drug user)   Chronic hepatitis C (HCC)   MRSA bacteremia   Other cirrhosis of liver (HCC)   Anemia   Ascites of liver   AKI (acute kidney injury) (HCC)   Pancytopenia (HCC)   Cirrhosis of liver with ascites (HCC)   Osteomyelitis of cervical spine (HCC)   Protein-calorie malnutrition, severe   C6- C7 discitis/ osteomyelitis/ New L1 L2 discitis with pathological fracture, paraspinal epidural inflammatory changes.  With  probable abscess of the right psoas muscle.  Sepsis ruled out.  ID on board and appreciate recommendations.  NS contacted and no intervention needed at this time.    MRSA Bacteremia:  No vegetation on echocardiogram.  Resume IV daptomycin.  ID following.    Cirrhosis with ascites / Hepatitis C/ Hyperbilirubinemia:  - abd persistently distended.  - US paracentesis ordered and attempted without any success.  Continue with lactulose twice daily, xifaxan, lasix, spironolactone. Slowly increasing the doses of meds as the patient's renal parameters have improved.  GI on board and appreciate recommendations.    IV drug abuse and alcohol abuse.  Resume pain meds as needed.  Plan to wean her off narcotics as tolerated.    AKI:  Creatinine is back to baseline at this time.   Anemia:  S/p 2 units of prbc transfusion.  Anemia of chronic disease;  Transfuse to keep hemoglobin greater than 7.  Hemoglobin around 8.3.    Pancytopenia probably secondary to liver dysfunction. Continue to monitor.   Hyponatremia Probably secondary to hypervolemic hyponatremia    DVT prophylaxis: SCD'S Code Status: (DNR) Family Communication: none at bedside.  Disposition:   Status is: Inpatient  Remains inpatient appropriate because: IV antibiotics.        Consultants:  Gastroenterology.  Infectious disease no BM today no new complaints.  Procedures: none.   Antimicrobials:  Antibiotics Given (last 72 hours)     Date/Time Action Medication Dose Rate   07/12/21 2012 Given   rifaximin (XIFAXAN) tablet 550 mg  550 mg    07/12/21 2139 New Bag/Given   DAPTOmycin (CUBICIN) 500 mg in sodium chloride 0.9 % IVPB 500 mg 120 mL/hr   07/13/21 1000 Given   rifaximin (XIFAXAN) tablet 550 mg 550 mg    07/13/21 1949 New Bag/Given   DAPTOmycin (CUBICIN) 500 mg in sodium chloride 0.9 % IVPB 500 mg 120 mL/hr   07/13/21 2248 Given   rifaximin (XIFAXAN) tablet 550 mg 550 mg    07/14/21 1008 Given    rifaximin (XIFAXAN) tablet 550 mg 550 mg    07/14/21 2018 New Bag/Given   DAPTOmycin (CUBICIN) 500 mg in sodium chloride 0.9 % IVPB 500 mg 120 mL/hr   07/14/21 2228 Given   rifaximin (XIFAXAN) tablet 550 mg 550 mg    07/15/21 1004 Given   rifaximin (XIFAXAN) tablet 550 mg 550 mg          Subjective: No BM today no chest pain shortness of breath no new complaints abdominal distention is worsening  Objective: Vitals:   07/14/21 1245 07/14/21 2003 07/15/21 0410 07/15/21 1420  BP: 121/82 132/81 122/80 125/76  Pulse: 89 94 87 93  Resp: Temp: 97.9 F (36.6 C) 97.9 F (36.6 C) 97.8 F (36.6 C) 98.1 F (36.7 C)  TempSrc: Oral Oral Oral Oral  SpO2: 100% 97% 97% 96%  Weight:      Height:        Intake/Output Summary (Last 24 hours) at 07/15/2021 1616 Last data filed at 07/15/2021 0500 Gross per 24 hour  Intake 14.39 ml  Output --  Net 14.39 ml    Filed Weights   07/11/21 0005  Weight: 52.2 kg    Examination: General exam: Appears calm and comfortable  Respiratory system: Clear to auscultation. Respiratory effort normal. Cardiovascular system: S1 & S2 heard, RRR. No JVD, murmurs, rubs, gallops or clicks. No pedal edema. Gastrointestinal system: Abdomen is soft, distended, nontender bowel sounds normal Central nervous system: Alert and oriented. No focal neurological deficits. Extremities: Symmetric 5 x 5 power. Skin: No rashes, lesions or ulcers Psychiatry: Mood & affect appropriate.       Data Reviewed: I have personally reviewed following labs and imaging studies  CBC: Recent Labs  Lab 07/11/21 0132 07/12/21 0339 07/13/21 0016 07/14/21 0523  WBC 2.9* 2.3*  --  3.0*  NEUTROABS 1.9  --   --  1.8  HGB 7.7* 6.1* 9.0* 8.3*  HCT 24.1* 19.0* 26.5* 25.0*  MCV 104.8* 103.8*  --  97.7  PLT 120* 112*  --  135*     Basic Metabolic Panel: Recent Labs  Lab 07/11/21 0132 07/12/21 0339 07/13/21 0402 07/14/21 0523 07/15/21 1450  NA 133* 133*   --  131* 128*  K 4.3 4.1  --  4.3 3.9  CL 106 108  --  104 100  CO2 20* 19*  --  22 23  GLUCOSE 107* 139*  --  108* 119*  BUN 32* 23*  --  22* 21*  CREATININE 1.58* 1.12* 1.17* 0.94 0.87  CALCIUM 8.3* 7.9*  --  8.0* 7.5*  MG  --   --   --  2.2  --      GFR: Estimated Creatinine Clearance: 53.8 mL/min (by C-G formula based on SCr of 0.87 mg/dL).  Liver Function Tests: Recent Labs  Lab 07/11/21 0132 07/12/21 0339 07/14/21 0523  AST 51* 38 43*  ALT ALKPHOS 77 58 62  BILITOT 2.4* 1.8* 2.0*  PROT  8.7* 7.3 7.5  ALBUMIN 2.4* 2.1* 2.1*     CBG: No results for input(s): GLUCAP in the last 168 hours.   Recent Results (from the past 240 hour(s))  Blood Culture (routine x 2)     Status: None (Preliminary result)   Collection Time: 07/11/21  1:32 AM   Specimen: BLOOD  Result Value Ref Range Status   Specimen Description   Final    BLOOD BLOOD LEFT FOREARM Performed at [facility], [address] [place], [place] [address]    Special Requests   Final    BOTTLES DRAWN AEROBIC AND ANAEROBIC Blood Culture results may not be optimal due to an inadequate volume of blood received in culture bottles Performed at [facility], [address], [place], [place] [address]    Culture   Final    NO GROWTH 4 DAYS Performed at [facility], [address] [place], [place] [address]    Report Status PENDING  Incomplete  Blood Culture (routine x 2)     Status: None (Preliminary result)   Collection Time: 07/11/21  1:34 AM   Specimen: BLOOD  Result Value Ref Range Status   Specimen Description   Final    BLOOD BLOOD RIGHT FOREARM Performed at [facility], [address] [place], [place] [address]    Special Requests   Final    BOTTLES DRAWN AEROBIC AND ANAEROBIC Blood Culture adequate volume Performed at [facility], [address], [place], [place] [address]    Culture   Final    NO  GROWTH 4 DAYS Performed at [facility], [address] [place], [place] [address]    Report Status PENDING  Incomplete  Resp Panel by RT-PCR (Flu A&B, Covid) Nasopharyngeal Swab     Status: None   Collection Time: 07/11/21 12:47 PM   Specimen: Nasopharyngeal Swab; Nasopharyngeal(NP) swabs in vial transport medium  Result Value Ref Range Status   SARS Coronavirus 2 by RT PCR NEGATIVE NEGATIVE Final    Comment: (NOTE) SARS-CoV-2 target nucleic acids are NOT DETECTED.  The SARS-CoV-2 RNA is generally detectable in upper respiratory specimens during the acute phase of infection. The lowest concentration of SARS-CoV-2 viral copies this assay can detect is 138 copies/mL. A negative result does not preclude SARS-Cov-2 infection and should not be used as the sole basis for treatment or other patient management decisions. A negative result may occur with  improper specimen collection/handling, submission of specimen other than nasopharyngeal swab, presence of viral mutation(s) within the areas targeted by this assay, and inadequate number of viral copies(<138 copies/mL). A negative result must be combined with clinical observations, patient history, and epidemiological information. The expected result is Negative.  Fact Sheet for Patients:  BloggerCourse.com  Fact Sheet for Healthcare Providers:  SeriousBroker.it  This test is no t yet approved or cleared by the [place] FDA and  has been authorized for detection and/or diagnosis of SARS-CoV-2 by FDA under an Emergency Use Authorization (EUA). This EUA will remain  in effect (meaning this test can be used) for the duration of the COVID-19 declaration under Section 564(b)(1) of the Act, 21 U.S.C.section 360bbb-3(b)(1), unless the authorization is terminated  or revoked sooner.       Influenza A by PCR NEGATIVE NEGATIVE Final   Influenza B by PCR NEGATIVE NEGATIVE Final     Comment: (NOTE) The Xpert Xpress SARS-CoV-2/FLU/RSV plus assay is intended as an aid in  the diagnosis of influenza from Nasopharyngeal swab specimens and should not be used as a sole basis for treatment. Nasal washings and aspirates are unacceptable for Xpert Xpress SARS-CoV-2/FLU/RSV testing.  Fact Sheet for Patients: BloggerCourse.com  Fact Sheet for Healthcare Providers: SeriousBroker.it  This test is not yet approved or cleared by the [place] FDA and has been authorized for detection and/or diagnosis of SARS-CoV-2 by FDA under an Emergency Use Authorization (EUA). This EUA will remain in effect (meaning this test can be used) for the duration of the COVID-19 declaration under Section 564(b)(1) of the Act, 21 U.S.C. section 360bbb-3(b)(1), unless the authorization is terminated or revoked.  Performed at [facility], [address] [place], [place] [address]           Radiology Studies: DG Abd 1 View  Result Date: 07/14/2021 CLINICAL DATA:  Abdominal pain and distension EXAM: ABDOMEN - 1 VIEW COMPARISON:  None. FINDINGS: Mild amount of retained stool in the colon. Nonobstructive bowel gas pattern. Stomach mildly distended. No urinary tract calcifications. No acute skeletal abnormality. IMPRESSION: Nonobstructive bowel gas pattern. Mild amount of stool in the colon. Mildly distended stomach. Electronically Signed   By: Marlan Palau M.D.   On: 07/14/2021 20:36        Scheduled Meds:  (feeding supplement) PROSource Plus  30 mL Oral TID   buprenorphine-naloxone  2 tablet Sublingual BID   Chlorhexidine Gluconate Cloth  6 each Topical Daily   [START ON 07/16/2021] furosemide  60 mg Oral Daily   gabapentin  200 mg Oral TID    HYDROmorphone (DILAUDID) injection  0.5 mg Intravenous Once   lactulose  20 g Oral TID   methocarbamol  500 mg Oral TID   multivitamin with minerals  1 tablet Oral Daily    rifaximin  550 mg Oral BID   sodium chloride flush  10-40 mL Intracatheter Q12H   [START ON 07/16/2021] spironolactone  150 mg Oral Daily   Continuous Infusions:  sodium chloride 10 mL/hr (07/14/21 2014)   albumin human Stopped (07/13/21 1847)   DAPTOmycin (CUBICIN)  IV 500 mg (07/14/21 2018)     LOS: 4 days        Kathlen Mody, MD [facility]   To contact the attending provider between 7A-7P or the covering provider during after hours 7P-7A, please log into the web site [email or web address] and access using universal  password for that web site. If you do not have the password, please call the hospital operator.  07/15/2021, 4:16 PM

## 2021-07-16 ENCOUNTER — Inpatient Hospital Stay (HOSPITAL_COMMUNITY): Payer: Self-pay

## 2021-07-16 LAB — CBC WITH DIFFERENTIAL/PLATELET
Abs Immature Granulocytes: 0.03 10*3/uL (ref 0.00–0.07)
Basophils Absolute: 0 10*3/uL (ref 0.0–0.1)
Basophils Relative: 1 %
Eosinophils Absolute: 0.1 10*3/uL (ref 0.0–0.5)
Eosinophils Relative: 3 %
HCT: 24.8 % — ABNORMAL LOW (ref 36.0–46.0)
Hemoglobin: 8.3 g/dL — ABNORMAL LOW (ref 12.0–15.0)
Immature Granulocytes: 1 %
Lymphocytes Relative: 23 %
Lymphs Abs: 0.7 10*3/uL (ref 0.7–4.0)
MCH: 32.2 pg (ref 26.0–34.0)
MCHC: 33.5 g/dL (ref 30.0–36.0)
MCV: 96.1 fL (ref 80.0–100.0)
Monocytes Absolute: 0.4 10*3/uL (ref 0.1–1.0)
Monocytes Relative: 12 %
Neutro Abs: 1.8 10*3/uL (ref 1.7–7.7)
Neutrophils Relative %: 60 %
Platelets: 130 10*3/uL — ABNORMAL LOW (ref 150–400)
RBC: 2.58 MIL/uL — ABNORMAL LOW (ref 3.87–5.11)
RDW: 19.8 % — ABNORMAL HIGH (ref 11.5–15.5)
WBC: 3 10*3/uL — ABNORMAL LOW (ref 4.0–10.5)
nRBC: 0 % (ref 0.0–0.2)

## 2021-07-16 LAB — BASIC METABOLIC PANEL
Anion gap: 5 (ref 5–15)
BUN: 22 mg/dL — ABNORMAL HIGH (ref 6–20)
CO2: 23 mmol/L (ref 22–32)
Calcium: 7.7 mg/dL — ABNORMAL LOW (ref 8.9–10.3)
Chloride: 99 mmol/L (ref 98–111)
Creatinine, Ser: 0.79 mg/dL (ref 0.44–1.00)
GFR, Estimated: >60 mL/min (ref >60)
Glucose, Bld: 118 mg/dL — ABNORMAL HIGH (ref 70–99)
Potassium: 3.9 mmol/L (ref 3.5–5.1)
Sodium: 127 mmol/L — ABNORMAL LOW (ref 135–145)

## 2021-07-16 LAB — CULTURE, BLOOD (ROUTINE X 2)
Culture: NO GROWTH
Culture: NO GROWTH
Special Requests: ADEQUATE

## 2021-07-16 IMAGING — US US PARACENTESIS
1 series · 6 of 6 positions shown · non-contrast
Comparison: none

INDICATION: Patient with history of hep C and cirrhosis with moderate volume
ascites. Request for IR to perform therapeutic paracentesis.

[Series 1: us paracentesis mc & wl · 6 of 6 slices shown]
[im 1/6]
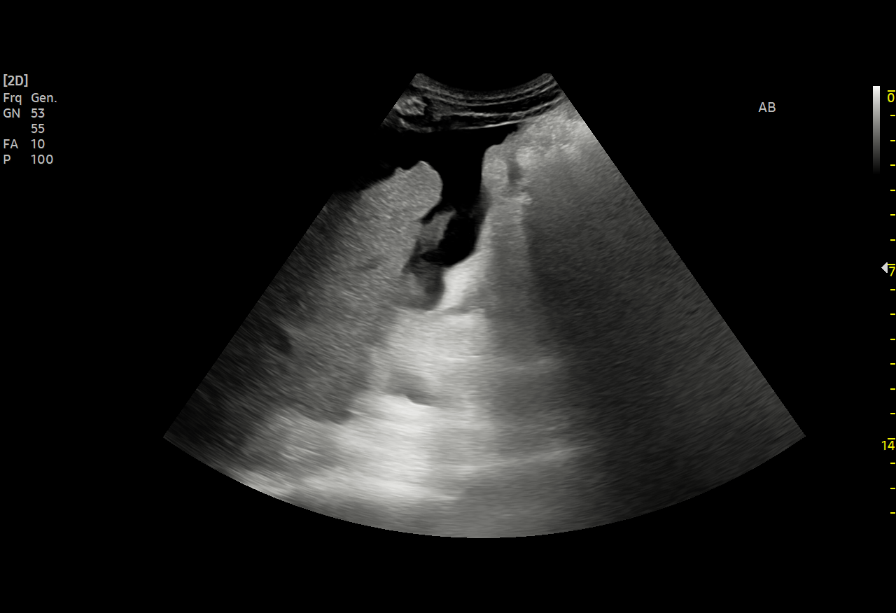
[im 2/6]
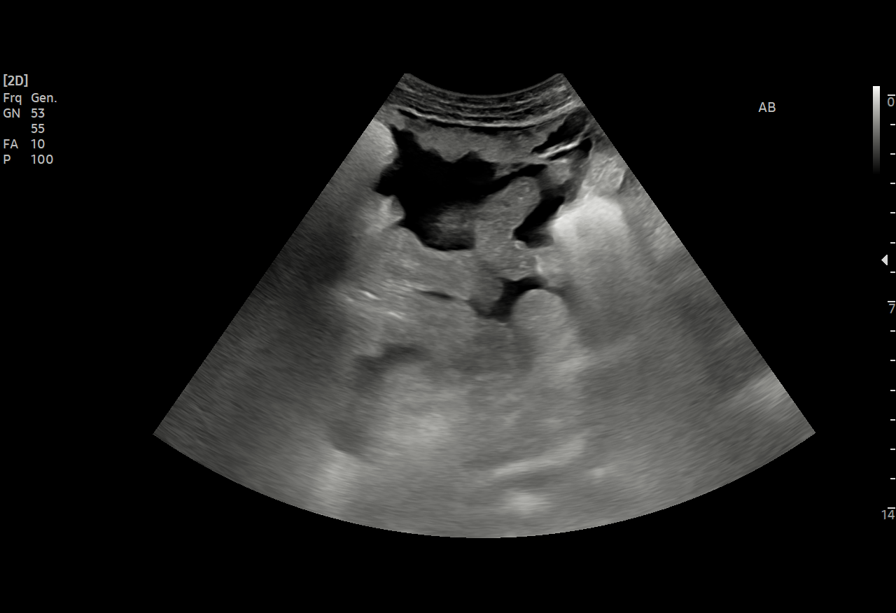
[im 3/6]
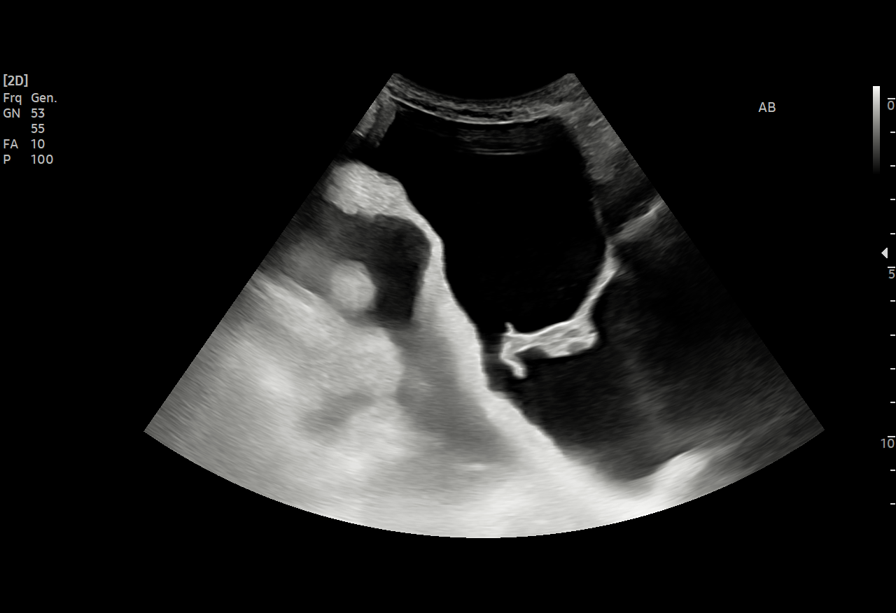
[im 4/6]
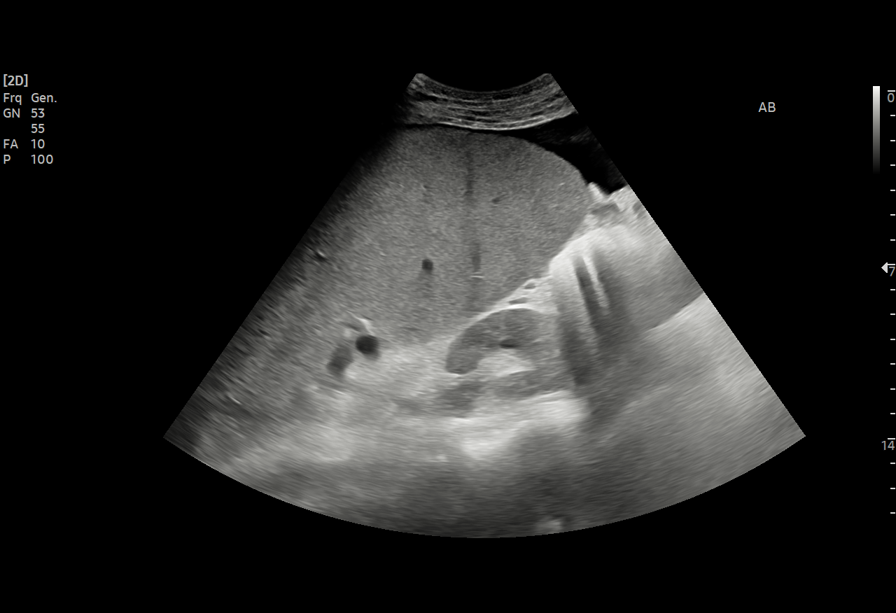
[im 5/6]
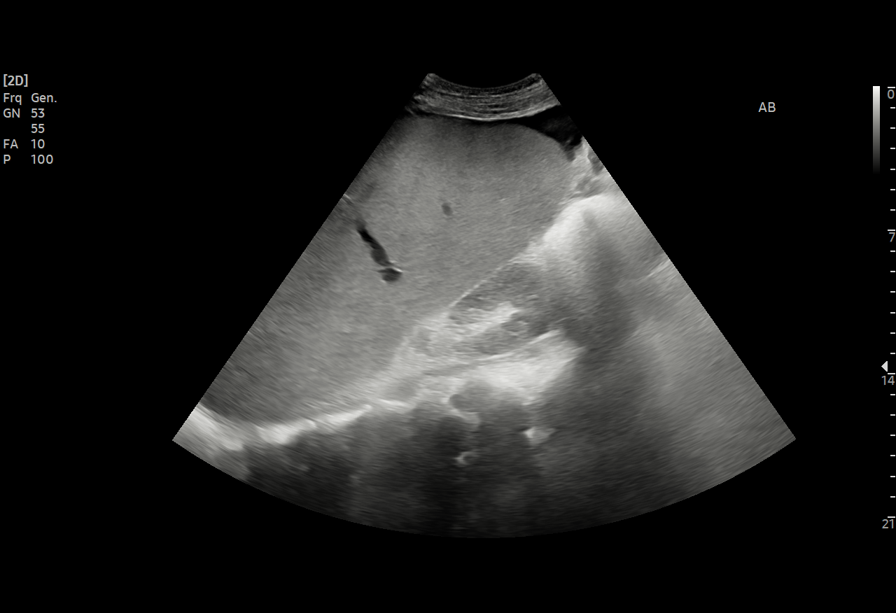
[im 6/6]
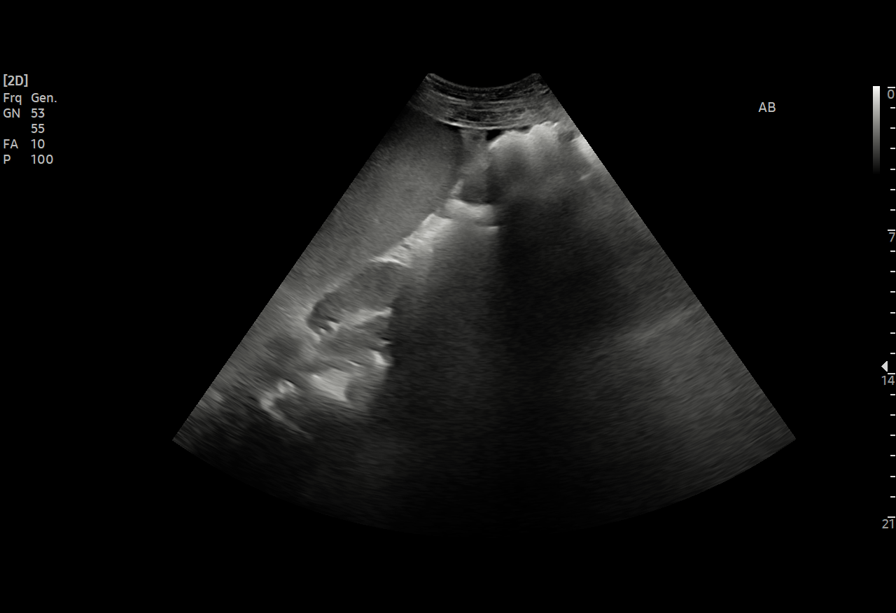

[6 of 6 positions shown; findings below may reference images not displayed]

EXAM:
ULTRASOUND GUIDED THERAPEUTIC PARACENTESIS

MEDICATIONS:
10ml 1% lidocaine

COMPLICATIONS:
None immediate.

PROCEDURE:
Informed written consent was obtained from the patient after a
discussion of the risks, benefits and alternatives to treatment. A
timeout was performed prior to the initiation of the procedure.

Initial ultrasound scanning demonstrates a moderate amount of
ascites within the left lower abdominal quadrant. The left lower
abdomen was prepped and draped in the usual sterile fashion. 1%
lidocaine was used for local anesthesia.

Following this, a 19 gauge, 7-cm, Yueh catheter was introduced. An
ultrasound image was saved for documentation purposes. The
paracentesis was performed. The catheter was removed and a dressing
was applied. The patient tolerated the procedure well without
immediate post procedural complication.
FINDINGS: A total of approximately 2.9 L of hazy, amber fluid was removed.
IMPRESSION: Successful ultrasound-guided paracentesis yielding 2.9 liters of
peritoneal fluid.

## 2021-07-16 MED ORDER — LIDOCAINE HCL 1 % IJ SOLN
INTRAMUSCULAR | Status: AC
  Administered 2021-07-16: 10 mL
  Filled 2021-07-16: qty 20

## 2021-07-16 MED ORDER — SPIRONOLACTONE 100 MG PO TABS
200.0000 mg | ORAL_TABLET | Freq: Every day | ORAL | Status: DC
  Administered 2021-07-17 – 2021-07-19 (×3): 200 mg via ORAL
  Filled 2021-07-16 (×3): qty 2

## 2021-07-16 NOTE — Progress Notes (Signed)
PROGRESS NOTE    Christina Wyatt  YHC:[id number] DOB: [birth year] DOA: 07/10/2021 PCP: Georgann Housekeeper, MD    Chief Complaint  Patient presents with   Back Pain    Lower Back x4 hrs    Brief Narrative:   Patient 55 year old female history of hep C, polysubstance abuse, bipolar disorder, cirrhosis, who was admitted to [facility] on 11/5 with worsening abdominal pain, lower extremity swelling, decreased appetite, worsening chronic back pain with subjective fevers and chills as well as shortness of breath and tachypnea.  Patient admitted at [facility] hospital with concerns for sepsis noted to have a bacteremia which grew MRSA, patient underwent MRI of the C-spine which showed discitis and osteomyelitis at C5, C6-C7, epidural thickening from C5-C6 disc space down to near C7 and T1 concerning for epidural abscess.  Patient underwent a 2D echo, TEE negative for endocarditis.  Admission at that time complicated with a thrombosed basilic vein on the left as well as phlebitis on the right with some purulence.  Patient also noted to have severe cirrhosis of the liver presumably from hep C and alcohol abuse.   ID consulted patient placed on IV vancomycin initially and changed back to IV daptomycin per ID recommendations.  Patient seen by neurosurgery and feel no surgical intervention needed at this time.  GI consulted to help optimize cirrhosis.          Assessment & Plan:   Principal Problem:   Diskitis Active Problems:   IVDU (intravenous drug user)   Chronic hepatitis C (HCC)   MRSA bacteremia   Other cirrhosis of liver (HCC)   Anemia   Ascites of liver   AKI (acute kidney injury) (HCC)   Pancytopenia (HCC)   Cirrhosis of liver with ascites (HCC)   Osteomyelitis of cervical spine (HCC)   Protein-calorie malnutrition, severe   C6- C7 discitis/ osteomyelitis/ New L1 L2 discitis with pathological fracture, paraspinal epidural inflammatory changes.  With  probable abscess of the right psoas muscle.  Sepsis ruled out.  ID on board and appreciate recommendations.  NS contacted and no intervention needed at this time.    MRSA Bacteremia:  No vegetation on echocardiogram.  Resume IV daptomycin.  ID following.    Cirrhosis with ascites / Hepatitis C/ Hyperbilirubinemia:  - abd persistently distended.  - US paracentesis ordered and attempted without any success.  Continue with lactulose twice daily, xifaxan, lasix, spironolactone. Slowly increasing the doses of meds as the patient's renal parameters have improved.  GI on board and appreciate recommendations.    IV drug abuse and alcohol abuse.  Resume suboxone 2 tab BID.  Resume pain meds as needed.  Appreciate palliative input .    AKI:  Creatinine is back to baseline at this time.   Anemia:  S/p 2 units of prbc transfusion.  Anemia of chronic disease;  Transfuse to keep hemoglobin greater than 7.  Hemoglobin around 8.3.    Pancytopenia probably secondary to liver dysfunction. Continue to monitor.   Hyponatremia Probably secondary to hypervolemic hyponatremia, US paracentesis ordered.  Urine sodium ordered. Get TSH Serum osmolality will be ordered.      DVT prophylaxis: SCD'S Code Status: (DNR) Family Communication: none at bedside.  Disposition:   Status is: Inpatient  Remains inpatient appropriate because: IV antibiotics. / UNSAFE D/C PLAN.        Consultants:  Gastroenterology.  Infectious disease no BM today no new complaints.  Procedures: none.   Antimicrobials:  Antibiotics Given (last 72  hours)     Date/Time Action Medication Dose Rate   07/13/21 1949 New Bag/Given   DAPTOmycin (CUBICIN) 500 mg in sodium chloride 0.9 % IVPB 500 mg 120 mL/hr   07/13/21 2248 Given   rifaximin (XIFAXAN) tablet 550 mg 550 mg    07/14/21 1008 Given   rifaximin (XIFAXAN) tablet 550 mg 550 mg    07/14/21 2018 New Bag/Given   DAPTOmycin (CUBICIN) 500 mg in  sodium chloride 0.9 % IVPB 500 mg 120 mL/hr   07/14/21 2228 Given   rifaximin (XIFAXAN) tablet 550 mg 550 mg    07/15/21 1004 Given   rifaximin (XIFAXAN) tablet 550 mg 550 mg    07/15/21 2045 New Bag/Given   DAPTOmycin (CUBICIN) 500 mg in sodium chloride 0.9 % IVPB 500 mg 120 mL/hr   07/15/21 2149 Given   rifaximin (XIFAXAN) tablet 550 mg 550 mg          Subjective: BM this am.  She reports feeling better this am.  No new complaints.   Objective: Vitals:   07/15/21 2009 07/16/21 0031 07/16/21 0459 07/16/21 1228  BP: 117/79 130/79 118/75 129/81  Pulse: 92 88 85 90  Resp: 18 18 16 17   Temp: 98.2 F (36.8 C) 97.8 F (36.6 C) 98.3 F (36.8 C) 97.7 F (36.5 C)  TempSrc: Oral Oral Oral Oral  SpO2: 95% 97% 95% 97%  Weight:      Height:        Intake/Output Summary (Last 24 hours) at 07/16/2021 1432 Last data filed at 07/16/2021 0915 Gross per 24 hour  Intake 354 ml  Output --  Net 354 ml    Filed Weights   07/11/21 0005  Weight: 52.2 kg    Examination: General exam: Appears calm and comfortable  Respiratory system: Clear to auscultation. Respiratory effort normal. Cardiovascular system: S1 & S2 heard, RRR. No JVD, No pedal edema. Gastrointestinal system: Abdomen is soft, non tender, distended, bowel sounds wnl.  Central nervous system: Alert and oriented. No focal neurological deficits. Extremities: Symmetric 5 x 5 power. Skin: No rashes, lesions or ulcers Psychiatry: Mood & affect appropriate.        Data Reviewed: I have personally reviewed following labs and imaging studies  CBC: Recent Labs  Lab 07/11/21 0132 07/12/21 0339 07/13/21 0016 07/14/21 0523 07/16/21 0930  WBC 2.9* 2.3*  --  3.0* 3.0*  NEUTROABS 1.9  --   --  1.8 1.8  HGB 7.7* 6.1* 9.0* 8.3* 8.3*  HCT 24.1* 19.0* 26.5* 25.0* 24.8*  MCV 104.8* 103.8*  --  97.7 96.1  PLT 120* 112*  --  135* 130*     Basic Metabolic Panel: Recent Labs  Lab 07/11/21 0132 07/12/21 0339  07/13/21 0402 07/14/21 0523 07/15/21 1450 07/16/21 0930  NA 133* 133*  --  131* 128* 127*  K 4.3 4.1  --  4.3 3.9 3.9  CL 106 108  --  104 100 99  CO2 20* 19*  --  22 23 23   GLUCOSE 107* 139*  --  108* 119* 118*  BUN 32* 23*  --  22* 21* 22*  CREATININE 1.58* 1.12* 1.17* 0.94 0.87 0.79  CALCIUM 8.3* 7.9*  --  8.0* 7.5* 7.7*  MG  --   --   --  2.2  --   --      GFR: Estimated Creatinine Clearance: 58.5 mL/min (by C-G formula based on SCr of 0.79 mg/dL).  Liver Function Tests: Recent Labs  Lab 07/11/21 0132 07/12/21  1610 07/14/21 0523  AST 51* 38 43*  ALT ALKPHOS 77 58 62  BILITOT 2.4* 1.8* 2.0*  PROT 8.7* 7.3 7.5  ALBUMIN 2.4* 2.1* 2.1*     CBG: No results for input(s): GLUCAP in the last 168 hours.   Recent Results (from the past 240 hour(s))  Blood Culture (routine x 2)     Status: None   Collection Time: 07/11/21  1:32 AM   Specimen: BLOOD  Result Value Ref Range Status   Specimen Description   Final    BLOOD BLOOD LEFT FOREARM Performed at [facility], [address] [place], [place] [address]    Special Requests   Final    BOTTLES DRAWN AEROBIC AND ANAEROBIC Blood Culture results may not be optimal due to an inadequate volume of blood received in culture bottles Performed at [facility], [address], [place], [place] [address]    Culture   Final    NO GROWTH 5 DAYS Performed at [facility], [address] [place], [place] [address]    Report Status 07/16/2021 FINAL  Final  Blood Culture (routine x 2)     Status: None   Collection Time: 07/11/21  1:34 AM   Specimen: BLOOD  Result Value Ref Range Status   Specimen Description   Final    BLOOD BLOOD RIGHT FOREARM Performed at [facility], [address], [place], [place] [address]    Special Requests   Final    BOTTLES DRAWN AEROBIC AND ANAEROBIC Blood Culture adequate volume Performed at [facility], [address], [place], [place] [address]    Culture   Final    NO GROWTH 5 DAYS Performed at [facility], [address], [place], [place] [address]    Report Status 07/16/2021 FINAL  Final  Resp Panel by RT-PCR (Flu A&B, Covid) Nasopharyngeal Swab     Status: None   Collection Time: 07/11/21 12:47 PM   Specimen: Nasopharyngeal Swab; Nasopharyngeal(NP) swabs in vial transport medium  Result Value Ref Range Status   SARS Coronavirus 2 by RT PCR NEGATIVE NEGATIVE Final    Comment: (NOTE) SARS-CoV-2 target nucleic acids are NOT DETECTED.  The SARS-CoV-2 RNA is generally detectable in upper respiratory specimens during the acute phase of infection. The lowest concentration of SARS-CoV-2 viral copies this assay can detect is 138 copies/mL. A negative result does not preclude SARS-Cov-2 infection and should not be used as the sole basis for treatment or other patient management decisions. A negative result may occur with  improper specimen collection/handling, submission of specimen other than nasopharyngeal swab, presence of viral mutation(s) within the areas targeted by this assay, and inadequate number of viral copies(<138 copies/mL). A negative result must be combined with clinical observations, patient history, and epidemiological information. The expected result is Negative.  Fact Sheet for Patients:  BloggerCourse.com  Fact Sheet for Healthcare Providers:  SeriousBroker.it  This test is no t yet approved or cleared by the [place] FDA and  has been authorized for detection and/or diagnosis of SARS-CoV-2 by FDA under an Emergency Use Authorization (EUA). This EUA will remain  in effect (meaning this test can be used) for the duration of the COVID-19 declaration under Section 564(b)(1) of the Act, 21 U.S.C.section 360bbb-3(b)(1), unless the authorization is terminated  or revoked sooner.        Influenza A by PCR NEGATIVE NEGATIVE Final   Influenza  B by PCR NEGATIVE NEGATIVE Final    Comment: (NOTE) The Xpert Xpress SARS-CoV-2/FLU/RSV plus assay is intended as an aid in the diagnosis of influenza from Nasopharyngeal swab specimens and should not be used as a sole basis for treatment. Nasal washings and aspirates are unacceptable for Xpert Xpress SARS-CoV-2/FLU/RSV testing.  Fact Sheet for Patients: BloggerCourse.com  Fact Sheet for Healthcare Providers: SeriousBroker.it  This test is not yet approved or cleared by the [place] FDA and has been authorized for detection and/or diagnosis of SARS-CoV-2 by FDA under an Emergency Use Authorization (EUA). This EUA will remain in effect (meaning this test can be used) for the duration of the COVID-19 declaration under Section 564(b)(1) of the Act, 21 U.S.C. section 360bbb-3(b)(1), unless the authorization is terminated or revoked.  Performed at [facility], [address], [place], [place] [address]           Radiology Studies: DG Abd 1 View  Result Date: 07/14/2021 CLINICAL DATA:  Abdominal pain and distension EXAM: ABDOMEN - 1 VIEW COMPARISON:  None. FINDINGS: Mild amount of retained stool in the colon. Nonobstructive bowel gas pattern. Stomach mildly distended. No urinary tract calcifications. No acute skeletal abnormality. IMPRESSION: Nonobstructive bowel gas pattern. Mild amount of stool in the colon. Mildly distended stomach. Electronically Signed   By: Marlan Palau M.D.   On: 07/14/2021 20:36        Scheduled Meds:  (feeding supplement) PROSource Plus  30 mL Oral TID   buprenorphine-naloxone  2 tablet Sublingual BID   Chlorhexidine Gluconate Cloth  6 each Topical Daily   furosemide  60 mg Oral Daily   gabapentin  200 mg Oral TID    HYDROmorphone (DILAUDID) injection  0.5 mg Intravenous Once   lactulose  20 g Oral TID   methocarbamol   500 mg Oral TID   multivitamin with minerals  1 tablet Oral Daily   rifaximin  550 mg Oral BID   sodium chloride flush  10-40 mL Intracatheter Q12H   spironolactone  150 mg Oral Daily   Continuous Infusions:  sodium chloride 5 mL/hr at 07/15/21 2144   albumin human Stopped (07/13/21 1847)   DAPTOmycin (CUBICIN)  IV 500 mg (07/15/21 2045)     LOS: 5 days        Kathlen Mody, MD [facility]   To contact the attending provider between 7A-7P or the covering provider during after hours 7P-7A, please log into the web site [email or web address] and access using universal [place] password for that web site. If you do not have the password, please call the hospital operator.  07/16/2021, 2:32 PM

## 2021-07-16 NOTE — TOC Progression Note (Signed)
Transition of Care [facility]) - Progression Note    Patient Details  Name: Christina Wyatt MRN: [id number] Date of Birth: 12-10-1965  Transition of Care [facility]) CM/SW Contact  Ida Rogue, [place] Phone Number: 07/16/2021, 3:35 PM  Clinical Narrative:   Patient who is stable for d/c can return to [facility] when LOG is approved internally. TOC will continue to follow during the course of hospitalization.     Expected Discharge Plan: Skilled Nursing Facility Barriers to Discharge: Other (must enter comment) (LOG approval)  Expected Discharge Plan and Services Expected Discharge Plan: Skilled Nursing Facility   Discharge Planning Services: CM Consult   Living arrangements for the past 2 months: Skilled Nursing Facility [facility])                                       Social Determinants of Health (SDOH) Interventions    Readmission Risk Interventions No flowsheet data found.

## 2021-07-16 NOTE — [place] FL2 (Signed)
[place] MEDICAID FL2 LEVEL OF CARE SCREENING TOOL     IDENTIFICATION  Patient Name: Christina Wyatt Birthdate: Feb 12, 1966 Sex: female Admission Date (Current Location): 07/10/2021  [facility] and [place] Number:  Producer, television/film/video and Address:  [facility],  [address] [place], [place] [address]      Provider Number: [id number]  Attending Physician Name and Address:  Kathlen Mody, MD  Relative Name and Phone Number:  Phineas Real (Sister)   [phone]    Current Level of Care: Hospital Recommended Level of Care: Skilled Nursing Facility Prior Approval Number:    Date Approved/Denied:   PASRR Number: [id number]  Discharge Plan: SNF    Current Diagnoses: Patient Active Problem List   Diagnosis Date Noted   Protein-calorie malnutrition, severe 07/13/2021   Other cirrhosis of liver (HCC) 07/12/2021   Anemia 07/12/2021   Ascites of liver 07/12/2021   AKI (acute kidney injury) (HCC) 07/12/2021   Pancytopenia (HCC) 07/12/2021   Cirrhosis of liver with ascites (HCC)    Osteomyelitis of cervical spine (HCC)    Sepsis (HCC) 07/11/2021   Diskitis 07/11/2021   IVDU (intravenous drug user) 07/11/2021   MRSA bacteremia 07/11/2021   COVID-19 virus infection 09/15/2020   Chronic hepatitis C (HCC) 08/29/2011    Orientation RESPIRATION BLADDER Height & Weight     Self, Time, Situation, Place  Normal Continent Weight: 52.2 kg Height:  5\' 1"  (154.9 cm)  BEHAVIORAL SYMPTOMS/MOOD NEUROLOGICAL BOWEL NUTRITION STATUS      Continent Diet (see d/c summary)  AMBULATORY STATUS COMMUNICATION OF NEEDS Skin   Extensive Assist Verbally Normal                       Personal Care Assistance Level of Assistance  Bathing, Feeding, Dressing Bathing Assistance: Limited assistance Feeding assistance: Independent Dressing Assistance: Limited assistance     Functional Limitations Info  Sight, Hearing, Speech Sight Info: Adequate Hearing Info: Adequate Speech Info:  Adequate    SPECIAL CARE FACTORS FREQUENCY  PT (By licensed PT)     PT Frequency: 5X/W              Contractures Contractures Info: Not present    Additional Factors Info  Code Status, Allergies Code Status Info: DNR Allergies Info: NKA           Current Medications (07/16/2021):  This is the current hospital active medication list Current Facility-Administered Medications  Medication Dose Route Frequency Provider Last Rate Last Admin   (feeding supplement) PROSource Plus liquid 30 mL  30 mL Oral TID 14/09/2020, MD       0.9 %  sodium chloride infusion   Intravenous PRN Rodolph Bong, MD 5 mL/hr at 07/15/21 2144 New Bag at 07/15/21 2144   albumin human 25 % solution 25 g  25 g Intravenous Once 2145, MD   Held at 07/13/21 1847   alum & mag hydroxide-simeth (MAALOX/MYLANTA) 200-200-20 MG/5ML suspension 30 mL  30 mL Oral Q4H PRN 02-21-2001, MD   30 mL at 07/16/21 1230   buprenorphine-naloxone (SUBOXONE) 2-0.5 mg per SL tablet 2 tablet  2 tablet Sublingual BID 14/02/22, MD   2 tablet at 07/16/21 1017   Chlorhexidine Gluconate Cloth 2 % PADS 6 each  6 each Topical Daily 14/02/22, Tyrone A, DO   6 each at 07/16/21 1021   DAPTOmycin (CUBICIN) 500 mg in sodium chloride 0.9 % IVPB  10 mg/kg Intravenous Q2000 14/02/22, [facility]  120 mL/hr at 07/15/21 2045 500 mg at 07/15/21 2045   furosemide (LASIX) tablet 60 mg  60 mg Oral Daily Kathlen Mody, MD   60 mg at 07/16/21 1018   gabapentin (NEURONTIN) capsule 200 mg  200 mg Oral TID Margie Ege A, DO   200 mg at 07/16/21 1019   HYDROmorphone (DILAUDID) injection 0.5 mg  0.5 mg Intravenous Once Luiz Iron, NP       HYDROmorphone (DILAUDID) injection 0.5 mg  0.5 mg Intravenous Q6H PRN Rodolph Bong, MD       lactulose (CHRONULAC) 10 GM/15ML solution 20 g  20 g Oral TID Kathlen Mody, MD   20 g at 07/16/21 1014   lidocaine (XYLOCAINE) 1 % (with pres) injection            melatonin tablet 3 mg  3 mg  Oral QHS PRN Margie Ege A, DO   3 mg at 07/16/21 0025   methocarbamol (ROBAXIN) tablet 500 mg  500 mg Oral TID Margie Ege A, DO   500 mg at 07/16/21 1020   multivitamin with minerals tablet 1 tablet  1 tablet Oral Daily Ronaldo Miyamoto, Tyrone A, DO   1 tablet at 07/16/21 1019   ondansetron (ZOFRAN) tablet 4 mg  4 mg Oral Q6H PRN Margie Ege A, DO       Or   ondansetron (ZOFRAN) injection 4 mg  4 mg Intravenous Q6H PRN Ronaldo Miyamoto, Tyrone A, DO   4 mg at 07/13/21 0200   oxyCODONE (Oxy IR/ROXICODONE) immediate release tablet 5 mg  5 mg Oral Q4H PRN Ronaldo Miyamoto, Tyrone A, DO   5 mg at 07/16/21 1433   prochlorperazine (COMPAZINE) injection 10 mg  10 mg Intravenous Q6H PRN Rodolph Bong, MD       rifaximin Burman Blacksmith) tablet 550 mg  550 mg Oral BID Rodolph Bong, MD   550 mg at 07/16/21 1016   sodium chloride flush (NS) 0.9 % injection 10-40 mL  10-40 mL Intracatheter Q12H Kyle, Tyrone A, DO   10 mL at 07/16/21 1021   sodium chloride flush (NS) 0.9 % injection 10-40 mL  10-40 mL Intracatheter PRN Ronaldo Miyamoto, Tyrone A, DO   10 mL at 07/16/21 0533   [START ON 07/17/2021] spironolactone (ALDACTONE) tablet 200 mg  200 mg Oral Daily Kathlen Mody, MD         Discharge Medications: Please see discharge summary for a list of discharge medications.  Relevant Imaging Results:  Relevant Lab Results:   Additional Information [phone] [place], [place]

## 2021-07-16 NOTE — Procedures (Signed)
PROCEDURE SUMMARY:  Successful US guided therapeutic paracentesis from LLQ.  Yielded 2.9 L of hazy, amber fluid.  No immediate complications.  Pt tolerated well.   Specimen not sent for labs.  EBL < 1 mL  Shon Hough, AGNP 07/16/2021 3:52 PM

## 2021-07-17 LAB — BASIC METABOLIC PANEL
Anion gap: 6 (ref 5–15)
BUN: 18 mg/dL (ref 6–20)
CO2: 25 mmol/L (ref 22–32)
Calcium: 7.8 mg/dL — ABNORMAL LOW (ref 8.9–10.3)
Chloride: 98 mmol/L (ref 98–111)
Creatinine, Ser: 0.81 mg/dL (ref 0.44–1.00)
GFR, Estimated: >60 mL/min (ref >60)
Glucose, Bld: 115 mg/dL — ABNORMAL HIGH (ref 70–99)
Potassium: 3.9 mmol/L (ref 3.5–5.1)
Sodium: 129 mmol/L — ABNORMAL LOW (ref 135–145)

## 2021-07-17 NOTE — Progress Notes (Signed)
PROGRESS NOTE    Christina Wyatt  CVE:[id number] DOB: 1965/08/24 DOA: 07/10/2021 PCP: Georgann Housekeeper, MD    Chief Complaint  Patient presents with   Back Pain    Lower Back x4 hrs    Brief Narrative:   Patient 55 year old female history of hep C, polysubstance abuse, bipolar disorder, cirrhosis, who was admitted to [facility] on 11/5 with worsening abdominal pain, lower extremity swelling, decreased appetite, worsening chronic back pain with subjective fevers and chills as well as shortness of breath and tachypnea.  Patient admitted at [facility] hospital with concerns for sepsis noted to have a bacteremia which grew MRSA, patient underwent MRI of the C-spine which showed discitis and osteomyelitis at C5, C6-C7, epidural thickening from C5-C6 disc space down to near C7 and T1 concerning for epidural abscess.  Patient underwent a 2D echo, TEE negative for endocarditis.  Admission at that time complicated with a thrombosed basilic vein on the left as well as phlebitis on the right with some purulence.  Patient also noted to have severe cirrhosis of the liver presumably from hep C and alcohol abuse.   ID consulted patient placed on IV vancomycin initially and changed back to IV daptomycin per ID recommendations.  Patient seen by neurosurgery and feel no surgical intervention needed at this time.  GI consulted to help optimize cirrhosis.   Pt stable for discharge to SNF when bed becomes available.         Assessment & Plan:   Principal Problem:   Diskitis Active Problems:   IVDU (intravenous drug user)   Chronic hepatitis C (HCC)   MRSA bacteremia   Other cirrhosis of liver (HCC)   Anemia   Ascites of liver   AKI (acute kidney injury) (HCC)   Pancytopenia (HCC)   Cirrhosis of liver with ascites (HCC)   Osteomyelitis of cervical spine (HCC)   Protein-calorie malnutrition, severe   C6- C7 discitis/ osteomyelitis/ New L1 L2 discitis with pathological  fracture, paraspinal epidural inflammatory changes.  With probable abscess of the right psoas muscle.  Sepsis ruled out.  ID on board and appreciate recommendations.  NS contacted and no intervention needed at this time.    MRSA Bacteremia:  No vegetation on echocardiogram.  Resume IV daptomycin.  ID following. Plan to continue the course of IV daptomcin till JAN 7th, 2022.  Will need outpatient follow up with ID.    Cirrhosis with ascites / Hepatitis C/ Hyperbilirubinemia:  US paracentesis yesterday and 2.9 lit fluid taken out.pt feels comfortable.  Continue with lactulose twice daily, xifaxan, lasix, spironolactone. Slowly increasing the doses of meds as the patient's renal parameters have improved.  GI on board and appreciate recommendations.    IV drug abuse and alcohol abuse.  Resume suboxone 2 tab BID.  Resume pain meds as needed.  Appreciate palliative input .    AKI:  Creatinine is back to baseline at this time.   Anemia:  S/p 2 units of prbc transfusion.  Anemia of chronic disease;  Transfuse to keep hemoglobin greater than 7.  Hemoglobin around 8.3.    Pancytopenia probably secondary to liver dysfunction. Continue to monitor.   Hyponatremia Probably secondary to hypervolemic hyponatremia, Improving .  Am cortisol wnl.  Tsh and serum osmo are pending.     DVT prophylaxis: SCD'S Code Status: (DNR) Family Communication: none at bedside.  Disposition:   Status is: Inpatient  Remains inpatient appropriate because: IV antibiotics. / UNSAFE D/C PLAN.  Consultants:  Gastroenterology.  Infectious disease no BM today no new complaints.  Procedures: none.   Antimicrobials:  Antibiotics Given (last 72 hours)     Date/Time Action Medication Dose Rate   07/14/21 2018 New Bag/Given   DAPTOmycin (CUBICIN) 500 mg in sodium chloride 0.9 % IVPB 500 mg 120 mL/hr   07/14/21 2228 Given   rifaximin (XIFAXAN) tablet 550 mg 550 mg    07/15/21 1004  Given   rifaximin (XIFAXAN) tablet 550 mg 550 mg    07/15/21 2045 New Bag/Given   DAPTOmycin (CUBICIN) 500 mg in sodium chloride 0.9 % IVPB 500 mg 120 mL/hr   07/15/21 2149 Given   rifaximin (XIFAXAN) tablet 550 mg 550 mg    07/16/21 2105 New Bag/Given   DAPTOmycin (CUBICIN) 500 mg in sodium chloride 0.9 % IVPB 500 mg 120 mL/hr   07/16/21 2116 Given   rifaximin (XIFAXAN) tablet 550 mg 550 mg    07/17/21 1006 Given   rifaximin (XIFAXAN) tablet 550 mg 550 mg          Subjective: No new complaints.   Objective: Vitals:   07/17/21 1051 07/17/21 1254 07/17/21 1408 07/17/21 1435  BP:  119/77    Pulse:  85    Resp: 13 16 15 12   Temp:  97.8 F (36.6 C)    TempSrc:  Oral    SpO2:  95%    Weight:      Height:        Intake/Output Summary (Last 24 hours) at 07/17/2021 1501 Last data filed at 07/17/2021 0940 Gross per 24 hour  Intake 340 ml  Output 300 ml  Net 40 ml    Filed Weights   07/11/21 0005  Weight: 52.2 kg    Examination: General exam: Appears calm and comfortable  Respiratory system: Clear to auscultation. Respiratory effort normal. Cardiovascular system: S1 & S2 heard, RRR. No JVD,  No pedal edema. Gastrointestinal system: Abdomen is soft, mildly distended, non tender.  Normal bowel sounds heard. Central nervous system: Alert and oriented. No focal neurological deficits. Extremities: Symmetric 5 x 5 power. Skin: No rashes, lesions or ulcers Psychiatry: Mood & affect appropriate.         Data Reviewed: I have personally reviewed following labs and imaging studies  CBC: Recent Labs  Lab 07/11/21 0132 07/12/21 0339 07/13/21 0016 07/14/21 0523 07/16/21 0930  WBC 2.9* 2.3*  --  3.0* 3.0*  NEUTROABS 1.9  --   --  1.8 1.8  HGB 7.7* 6.1* 9.0* 8.3* 8.3*  HCT 24.1* 19.0* 26.5* 25.0* 24.8*  MCV 104.8* 103.8*  --  97.7 96.1  PLT 120* 112*  --  135* 130*     Basic Metabolic Panel: Recent Labs  Lab 07/12/21 0339 07/13/21 0402 07/14/21 0523  07/15/21 1450 07/16/21 0930 07/17/21 1300  NA 133*  --  131* 128* 127* 129*  K 4.1  --  4.3 3.9 3.9 3.9  CL 108  --  104 100 99 98  CO2 19*  --  22 23 23 25   GLUCOSE 139*  --  108* 119* 118* 115*  BUN 23*  --  22* 21* 22* 18  CREATININE 1.12* 1.17* 0.94 0.87 0.79 0.81  CALCIUM 7.9*  --  8.0* 7.5* 7.7* 7.8*  MG  --   --  2.2  --   --   --      GFR: Estimated Creatinine Clearance: 57.8 mL/min (by C-G formula based on SCr of 0.81 mg/dL).  Liver Function  Tests: Recent Labs  Lab 07/11/21 0132 07/12/21 0339 07/14/21 0523  AST 51* 38 43*  ALT 25 21 21   ALKPHOS 77 58 62  BILITOT 2.4* 1.8* 2.0*  PROT 8.7* 7.3 7.5  ALBUMIN 2.4* 2.1* 2.1*     CBG: No results for input(s): GLUCAP in the last 168 hours.   Recent Results (from the past 240 hour(s))  Blood Culture (routine x 2)     Status: None   Collection Time: 07/11/21  1:32 AM   Specimen: BLOOD  Result Value Ref Range Status   Specimen Description   Final    BLOOD BLOOD LEFT FOREARM Performed at [facility], [address], [place], [place]    Special Requests   Final    BOTTLES DRAWN AEROBIC AND ANAEROBIC Blood Culture results may not be optimal due to an inadequate volume of blood received in culture bottles Performed at [facility], [address] [place], [place]    Culture   Final    NO GROWTH 5 DAYS Performed at [facility], [address] [place], [place]    Report Status 07/16/2021 FINAL  Final  Blood Culture (routine x 2)     Status: None   Collection Time: 07/11/21  1:34 AM   Specimen: BLOOD  Result Value Ref Range Status   Specimen Description   Final    BLOOD BLOOD RIGHT FOREARM Performed at [facility], [address] [place], [place]    Special Requests   Final    BOTTLES DRAWN AEROBIC AND ANAEROBIC Blood Culture adequate volume Performed at [facility], [address] [place], [place]    Culture   Final    NO GROWTH 5 DAYS Performed at [facility], [address], [place], [place]    Report Status 07/16/2021 FINAL  Final  Resp Panel by RT-PCR (Flu A&B, Covid) Nasopharyngeal Swab     Status: None   Collection Time: 07/11/21 12:47 PM   Specimen: Nasopharyngeal Swab; Nasopharyngeal(NP) swabs in vial transport medium  Result Value Ref Range Status   SARS Coronavirus 2 by RT PCR NEGATIVE NEGATIVE Final    Comment: (NOTE) SARS-CoV-2 target nucleic acids are NOT DETECTED.  The SARS-CoV-2 RNA is generally detectable in upper respiratory specimens during the acute phase of infection. The lowest concentration of SARS-CoV-2 viral copies this assay can detect is 138 copies/mL. A negative result does not preclude SARS-Cov-2 infection and should not be used as the sole basis for treatment or other patient management decisions. A negative result may occur with  improper specimen collection/handling, submission of specimen other than nasopharyngeal swab, presence of viral mutation(s) within the areas targeted by this assay, and inadequate number of viral copies(<138 copies/mL). A negative result must be combined with clinical observations, patient history, and epidemiological information. The expected result is Negative.  Fact Sheet for Patients:  07/13/21  Fact Sheet for Healthcare Providers:  BloggerCourse.com  This test is no t yet approved or cleared by the [place] FDA and  has been authorized for detection and/or diagnosis of SARS-CoV-2 by FDA under an Emergency Use Authorization (EUA). This EUA will remain  in effect (meaning this test can be used) for the duration of the COVID-19 declaration under Section 564(b)(1) of the Act, 21 U.S.C.section 360bbb-3(b)(1), unless the authorization is terminated  or revoked sooner.       Influenza A by  PCR NEGATIVE  NEGATIVE Final   Influenza B by PCR NEGATIVE NEGATIVE Final    Comment: (NOTE) The Xpert Xpress SARS-CoV-2/FLU/RSV plus assay is intended as an aid in the diagnosis of influenza from Nasopharyngeal swab specimens and should not be used as a sole basis for treatment. Nasal washings and aspirates are unacceptable for Xpert Xpress SARS-CoV-2/FLU/RSV testing.  Fact Sheet for Patients: BloggerCourse.com  Fact Sheet for Healthcare Providers: SeriousBroker[employer]it  This test is not yet approved or cleared by the [place] FDA and has been authorized for detection and/or diagnosis of SARS-CoV-2 by FDA under an Emergency Use Authorization (EUA). This EUA will remain in effect (meaning this test can be used) for the duration of the COVID-19 declaration under Section 564(b)(1) of the Act, 21 U.S.C. section 360bbb-3(b)(1), unless the authorization is terminated or revoked.  Performed at [facility], [address] [place], [place] [address]           Radiology Studies: US Paracentesis  Result Date: 07/16/2021 INDICATION: Patient with history of hep C and cirrhosis with moderate volume ascites. Request for IR to perform therapeutic paracentesis. EXAM: ULTRASOUND GUIDED THERAPEUTIC PARACENTESIS MEDICATIONS: 10ml 1% lidocaine COMPLICATIONS: None immediate. PROCEDURE: Informed written consent was obtained from the patient after a discussion of the risks, benefits and alternatives to treatment. A timeout was performed prior to the initiation of the procedure. Initial ultrasound scanning demonstrates a moderate amount of ascites within the left lower abdominal quadrant. The left lower abdomen was prepped and draped in the usual sterile fashion. 1% lidocaine was used for local anesthesia. Following this, a 19 gauge, 7-cm, Yueh catheter was introduced. An ultrasound image was saved for documentation purposes. The paracentesis was  performed. The catheter was removed and a dressing was applied. The patient tolerated the procedure well without immediate post procedural complication. FINDINGS: A total of approximately 2.9 L of hazy, amber fluid was removed. IMPRESSION: Successful ultrasound-guided paracentesis yielding 2.9 liters of peritoneal fluid. Read by: Alex Gardener, NP Electronically Signed   By: Acquanetta Belling M.D.   On: 07/16/2021 16:04        Scheduled Meds:  (feeding supplement) PROSource Plus  30 mL Oral TID   buprenorphine-naloxone  2 tablet Sublingual BID   Chlorhexidine Gluconate Cloth  6 each Topical Daily   furosemide  60 mg Oral Daily   gabapentin  200 mg Oral TID    HYDROmorphone (DILAUDID) injection  0.5 mg Intravenous Once   lactulose  20 g Oral TID   methocarbamol  500 mg Oral TID   multivitamin with minerals  1 tablet Oral Daily   rifaximin  550 mg Oral BID   sodium chloride flush  10-40 mL Intracatheter Q12H   spironolactone  200 mg Oral Daily   Continuous Infusions:  sodium chloride 5 mL/hr at 07/15/21 2144   albumin human Stopped (07/13/21 1847)   DAPTOmycin (CUBICIN)  IV Stopped (07/16/21 2200)     LOS: 6 days        Kathlen Mody, MD [facility]   To contact the attending provider between 7A-7P or the covering provider during after hours 7P-7A, please log into the web site [email or web address] and access using universal [place] password for that web site. If you do not have the password, please call the hospital operator.  07/17/2021, 3:01 PM

## 2021-07-18 DIAGNOSIS — L8990 Pressure ulcer of unspecified site, unspecified stage: Secondary | ICD-10-CM | POA: Insufficient documentation

## 2021-07-18 LAB — TSH: TSH: 5.067 u[IU]/mL — ABNORMAL HIGH (ref 0.350–4.500)

## 2021-07-18 LAB — BASIC METABOLIC PANEL
Anion gap: 4 — ABNORMAL LOW (ref 5–15)
BUN: 18 mg/dL (ref 6–20)
CO2: 25 mmol/L (ref 22–32)
Calcium: 7.6 mg/dL — ABNORMAL LOW (ref 8.9–10.3)
Chloride: 99 mmol/L (ref 98–111)
Creatinine, Ser: 0.87 mg/dL (ref 0.44–1.00)
GFR, Estimated: >60 mL/min (ref >60)
Glucose, Bld: 137 mg/dL — ABNORMAL HIGH (ref 70–99)
Potassium: 3.5 mmol/L (ref 3.5–5.1)
Sodium: 128 mmol/L — ABNORMAL LOW (ref 135–145)

## 2021-07-18 LAB — OSMOLALITY: Osmolality: 277 mOsm/kg (ref 275–295)

## 2021-07-18 MED ORDER — IBUPROFEN 200 MG PO TABS
200.0000 mg | ORAL_TABLET | Freq: Once | ORAL | Status: AC
  Administered 2021-07-18: 12:00:00 200 mg via ORAL
  Filled 2021-07-18: qty 1

## 2021-07-18 NOTE — Progress Notes (Signed)
PROGRESS NOTE    Christina Wyatt  RXV:[id number] DOB: [birth year] DOA: 07/10/2021 PCP: Georgann Housekeeper, MD    Chief Complaint  Patient presents with   Back Pain    Lower Back x4 hrs    Brief Narrative:   Patient 55 year old female history of hep C, polysubstance abuse, bipolar disorder, cirrhosis, who was admitted to [facility] on 11/5 with worsening abdominal pain, lower extremity swelling, decreased appetite, worsening chronic back pain with subjective fevers and chills as well as shortness of breath and tachypnea.  Patient admitted at [facility] with concerns for sepsis noted to have a bacteremia which grew MRSA, patient underwent MRI of the C-spine which showed discitis and osteomyelitis at C5, C6-C7, epidural thickening from C5-C6 disc space down to near C7 and T1 concerning for epidural abscess.  Patient underwent a 2D echo, TEE negative for endocarditis.  Admission at that time complicated with a thrombosed basilic vein on the left as well as phlebitis on the right with some purulence.  Patient also noted to have severe cirrhosis of the liver presumably from hep C and alcohol abuse.   ID consulted patient placed on IV vancomycin initially and changed back to IV daptomycin per ID recommendations.  Patient seen by neurosurgery and feel no surgical intervention needed at this time.  GI consulted to help optimize cirrhosis.   Pt stable for discharge to SNF when bed becomes available.  Pt seen and examined, she reports some headache earlier this am. No other complaints.         Assessment & Plan:   Principal Problem:   Diskitis Active Problems:   IVDU (intravenous drug user)   Chronic hepatitis C (HCC)   MRSA bacteremia   Other cirrhosis of liver (HCC)   Anemia   Ascites of liver   AKI (acute kidney injury) (HCC)   Pancytopenia (HCC)   Cirrhosis of liver with ascites (HCC)   Osteomyelitis of cervical spine (HCC)   Protein-calorie  malnutrition, severe   Pressure injury of skin   C6- C7 discitis/ osteomyelitis/ New L1 L2 discitis with pathological fracture, paraspinal epidural inflammatory changes.  With probable abscess of the right psoas muscle.  Sepsis ruled out.  ID on board and appreciate recommendations.  NS contacted and no intervention needed at this time.    MRSA Bacteremia:  No vegetation on echocardiogram.  Resume IV daptomycin.  ID following. Plan to continue the course of IV daptomcin till JAN 7th, 2022.  Will need outpatient follow up with ID upon discharge.   Cirrhosis with ascites / Hepatitis C/ Hyperbilirubinemia:  US paracentesis 07/16/21 and 2.9 lit fluid taken out.pt feels comfortable. But abd still distended. Continue with lactulose twice daily, xifaxan, lasix, spironolactone. Slowly increasing the doses of meds as the patient's renal parameters have improved.  GI on board and appreciate recommendations.    IV drug abuse and alcohol abuse.  No withdrawal symptoms Resume suboxone 2 tab BID.  Resume pain meds as needed.  Recommend outpatient palliative care follow up.   AKI:  Creatinine is back to baseline at this time.   Anemia:  S/p 2 units of prbc transfusion.  Anemia of chronic disease;  Transfuse to keep hemoglobin greater than 7.  Hemoglobin around 8.3.    Pancytopenia probably secondary to liver dysfunction. Continue to monitor.   Hyponatremia Sodium stable around 128. Probably secondary to hypervolemic hyponatremia, Improving .  Am cortisol wnl.  Serum osmo 277. TSH Slightly elevated at 5.067.  DVT prophylaxis: SCD'S Code Status: (DNR) Family Communication: none at bedside.  Disposition:   Status is: Inpatient  Remains inpatient appropriate because: IV antibiotics. / UNSAFE D/C PLAN.        Consultants:  Gastroenterology.  Infectious disease no BM today no new complaints.  Procedures: none.   Antimicrobials:  Antibiotics Given (last 72  hours)     Date/Time Action Medication Dose Rate   07/15/21 2045 New Bag/Given   DAPTOmycin (CUBICIN) 500 mg in sodium chloride 0.9 % IVPB 500 mg 120 mL/hr   07/15/21 2149 Given   rifaximin (XIFAXAN) tablet 550 mg 550 mg    07/16/21 2105 New Bag/Given   DAPTOmycin (CUBICIN) 500 mg in sodium chloride 0.9 % IVPB 500 mg 120 mL/hr   07/16/21 2116 Given   rifaximin (XIFAXAN) tablet 550 mg 550 mg    07/17/21 1006 Given   rifaximin (XIFAXAN) tablet 550 mg 550 mg    07/17/21 2117 New Bag/Given   DAPTOmycin (CUBICIN) 500 mg in sodium chloride 0.9 % IVPB 500 mg 120 mL/hr   07/17/21 2122 Given   rifaximin (XIFAXAN) tablet 550 mg 550 mg    07/18/21 1019 Given   rifaximin (XIFAXAN) tablet 550 mg 550 mg          Subjective: Headache. No chest pain or sob. Back pain controlled  Objective: Vitals:   07/18/21 0830 07/18/21 0851 07/18/21 1048 07/18/21 1307  BP:   109/73   Pulse:   81   Resp: 18 18  18   Temp:   98.2 F (36.8 C)   TempSrc:   Oral   SpO2:   95%   Weight:      Height:        Intake/Output Summary (Last 24 hours) at 07/18/2021 1352 Last data filed at 07/18/2021 0554 Gross per 24 hour  Intake 566.18 ml  Output --  Net 566.18 ml    Filed Weights   07/11/21 0005  Weight: 52.2 kg    Examination:   General exam: Appears calm and comfortable  Respiratory system: Clear to auscultation. Respiratory effort normal. Cardiovascular system: S1 & S2 heard, RRR. No JVD,  No pedal edema. Gastrointestinal system: Abdomen is soft, distended, . Normal bowel sounds heard. Central nervous system: Alert and oriented. No focal neurological deficits. Extremities: Symmetric 5 x 5 power. Skin: No rashes, lesions or ulcers Psychiatry: Mood & affect appropriate.          Data Reviewed: I have personally reviewed following labs and imaging studies  CBC: Recent Labs  Lab 07/12/21 0339 07/13/21 0016 07/14/21 0523 07/16/21 0930  WBC 2.3*  --  3.0* 3.0*  NEUTROABS  --   --   1.8 1.8  HGB 6.1* 9.0* 8.3* 8.3*  HCT 19.0* 26.5* 25.0* 24.8*  MCV 103.8*  --  97.7 96.1  PLT 112*  --  135* 130*     Basic Metabolic Panel: Recent Labs  Lab 07/14/21 0523 07/15/21 1450 07/16/21 0930 07/17/21 1300 07/18/21 0301  NA 131* 128* 127* 129* 128*  K 4.3 3.9 3.9 3.9 3.5  CL 104 100 99 98 99  CO2 22 23 23 25 25   GLUCOSE 108* 119* 118* 115* 137*  BUN 22* 21* 22* 18 18  CREATININE 0.94 0.87 0.79 0.81 0.87  CALCIUM 8.0* 7.5* 7.7* 7.8* 7.6*  MG 2.2  --   --   --   --      GFR: Estimated Creatinine Clearance: 53.8 mL/min (by C-G formula based on SCr of  0.87 mg/dL).  Liver Function Tests: Recent Labs  Lab 07/12/21 0339 07/14/21 0523  AST 38 43*  ALT 21 21  ALKPHOS 58 62  BILITOT 1.8* 2.0*  PROT 7.3 7.5  ALBUMIN 2.1* 2.1*     CBG: No results for input(s): GLUCAP in the last 168 hours.   Recent Results (from the past 240 hour(s))  Blood Culture (routine x 2)     Status: None   Collection Time: 07/11/21  1:32 AM   Specimen: BLOOD  Result Value Ref Range Status   Specimen Description   Final    BLOOD BLOOD LEFT FOREARM Performed at [facility], [address] [place], [place] [address]    Special Requests   Final    BOTTLES DRAWN AEROBIC AND ANAEROBIC Blood Culture results may not be optimal due to an inadequate volume of blood received in culture bottles Performed at [facility], [address], [place], [place] [address]    Culture   Final    NO GROWTH 5 DAYS Performed at [facility], [address], [place], [place] [address]    Report Status 07/16/2021 FINAL  Final  Blood Culture (routine x 2)     Status: None   Collection Time: 07/11/21  1:34 AM   Specimen: BLOOD  Result Value Ref Range Status   Specimen Description   Final    BLOOD BLOOD RIGHT FOREARM Performed at [facility], [address], [place], [place] [address]    Special Requests   Final    BOTTLES DRAWN AEROBIC  AND ANAEROBIC Blood Culture adequate volume Performed at [facility], [address] [place], [place] [address]    Culture   Final    NO GROWTH 5 DAYS Performed at [facility], [address], [place], [place] [address]    Report Status 07/16/2021 FINAL  Final  Resp Panel by RT-PCR (Flu A&B, Covid) Nasopharyngeal Swab     Status: None   Collection Time: 07/11/21 12:47 PM   Specimen: Nasopharyngeal Swab; Nasopharyngeal(NP) swabs in vial transport medium  Result Value Ref Range Status   SARS Coronavirus 2 by RT PCR NEGATIVE NEGATIVE Final    Comment: (NOTE) SARS-CoV-2 target nucleic acids are NOT DETECTED.  The SARS-CoV-2 RNA is generally detectable in upper respiratory specimens during the acute phase of infection. The lowest concentration of SARS-CoV-2 viral copies this assay can detect is 138 copies/mL. A negative result does not preclude SARS-Cov-2 infection and should not be used as the sole basis for treatment or other patient management decisions. A negative result may occur with  improper specimen collection/handling, submission of specimen other than nasopharyngeal swab, presence of viral mutation(s) within the areas targeted by this assay, and inadequate number of viral copies(<138 copies/mL). A negative result must be combined with clinical observations, patient history, and epidemiological information. The expected result is Negative.  Fact Sheet for Patients:  BloggerCourse.com  Fact Sheet for Healthcare Providers:  SeriousBroker.it  This test is no t yet approved or cleared by the [place] FDA and  has been authorized for detection and/or diagnosis of SARS-CoV-2 by FDA under an Emergency Use Authorization (EUA). This EUA will remain  in effect (meaning this test can be used) for the duration of the COVID-19 declaration under Section 564(b)(1) of the Act, 21 U.S.C.section  360bbb-3(b)(1), unless the authorization is terminated  or revoked sooner.       Influenza A by PCR NEGATIVE  NEGATIVE Final   Influenza B by PCR NEGATIVE NEGATIVE Final    Comment: (NOTE) The Xpert Xpress SARS-CoV-2/FLU/RSV plus assay is intended as an aid in the diagnosis of influenza from Nasopharyngeal swab specimens and should not be used as a sole basis for treatment. Nasal washings and aspirates are unacceptable for Xpert Xpress SARS-CoV-2/FLU/RSV testing.  Fact Sheet for Patients: BloggerCourse.com  Fact Sheet for Healthcare Providers: SeriousBroker.it  This test is not yet approved or cleared by the [place] FDA and has been authorized for detection and/or diagnosis of SARS-CoV-2 by FDA under an Emergency Use Authorization (EUA). This EUA will remain in effect (meaning this test can be used) for the duration of the COVID-19 declaration under Section 564(b)(1) of the Act, 21 U.S.C. section 360bbb-3(b)(1), unless the authorization is terminated or revoked.  Performed at [facility], [address], [place], [place] [address]           Radiology Studies: US Paracentesis  Result Date: 07/16/2021 INDICATION: Patient with history of hep C and cirrhosis with moderate volume ascites. Request for IR to perform therapeutic paracentesis. EXAM: ULTRASOUND GUIDED THERAPEUTIC PARACENTESIS MEDICATIONS: 10ml 1% lidocaine COMPLICATIONS: None immediate. PROCEDURE: Informed written consent was obtained from the patient after a discussion of the risks, benefits and alternatives to treatment. A timeout was performed prior to the initiation of the procedure. Initial ultrasound scanning demonstrates a moderate amount of ascites within the left lower abdominal quadrant. The left lower abdomen was prepped and draped in the usual sterile fashion. 1% lidocaine was used for local anesthesia. Following this, a 19 gauge,  7-cm, Yueh catheter was introduced. An ultrasound image was saved for documentation purposes. The paracentesis was performed. The catheter was removed and a dressing was applied. The patient tolerated the procedure well without immediate post procedural complication. FINDINGS: A total of approximately 2.9 L of hazy, amber fluid was removed. IMPRESSION: Successful ultrasound-guided paracentesis yielding 2.9 liters of peritoneal fluid. Read by: Alex Gardener, NP Electronically Signed   By: Acquanetta Belling M.D.   On: 07/16/2021 16:04        Scheduled Meds:  (feeding supplement) PROSource Plus  30 mL Oral TID   buprenorphine-naloxone  2 tablet Sublingual BID   Chlorhexidine Gluconate Cloth  6 each Topical Daily   furosemide  60 mg Oral Daily   gabapentin  200 mg Oral TID    HYDROmorphone (DILAUDID) injection  0.5 mg Intravenous Once   lactulose  20 g Oral TID   methocarbamol  500 mg Oral TID   multivitamin with minerals  1 tablet Oral Daily   rifaximin  550 mg Oral BID   sodium chloride flush  10-40 mL Intracatheter Q12H   spironolactone  200 mg Oral Daily   Continuous Infusions:  sodium chloride Stopped (07/16/21 0011)   albumin human Stopped (07/13/21 1847)   DAPTOmycin (CUBICIN)  IV Stopped (07/17/21 2149)     LOS: 7 days        Kathlen Mody, MD [facility]   To contact the attending provider between 7A-7P or the covering provider during after hours 7P-7A, please log into the web site [email or web address] and access using universal [place] password for that web site. If you do not have the password, please call the hospital operator.  07/18/2021, 1:52 PM

## 2021-07-18 NOTE — Progress Notes (Signed)
Occupational Therapy Treatment Patient Details Name: Christina Wyatt MRN: [id number] DOB: 22-Jun-1966 Today's Date: 07/18/2021   History of present illness 55 y.o. female with medical history significant of Hepatitis C, bipolar d/o; polysubstance abuse. Presented w/ back pain and stomach pain.  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 H/O osteo/discitis C 5-6   OT comments  Treatment focused on toileting, LB dressing and activity tolerance with ambulation. All tasks required increased time due to patient's pain requiring slow movements. Patient min guard for transfers and ambulation with RW, supervision for toileting and min guard for toilet transfer. No overt loss of balance. Patient unable to don socks or lotion feet due to pain and unable to tolerate figure four positioning either. Patient requesting to ambulate in hall and did so very slowly with walker - bearing down through it to offload secondary to pain. Patient reports today is a "bad day" in regards to pain and being able to perform self care tasks.Patient also reports continuing to feel weak and needing more assistance. Will recommend return to facility to continue therapy.   Recommendations for follow up therapy are one component of a multi-disciplinary discharge planning process, led by the attending physician.  Recommendations may be updated based on patient status, additional functional criteria and insurance authorization.    Follow Up Recommendations  Skilled nursing-short term rehab (<3 hours/day)    Assistance Recommended at Discharge Set up Supervision/Assistance  Equipment Recommendations  BSC/3in1;Tub/shower seat    Recommendations for Other Services      Precautions / Restrictions Precautions Precautions: Back Precaution Booklet Issued: No Precaution Comments: Reviewed log rolling, no bend or twist Restrictions Weight Bearing Restrictions: No        Mobility Bed Mobility Overal bed mobility: Modified Independent             General bed mobility comments: increased time to transition due to pain but no physical assistance required.    Transfers Overall transfer level: Needs assistance Equipment used: Rolling walker (2 wheels) Transfers: Sit to/from Stand Sit to Stand: Min guard           General transfer comment: Min guard for ambulation with walker. Reports needing walker to bear down through due to pain.     Balance Overall balance assessment: Mild deficits observed, not formally tested                                         ADL either performed or assessed with clinical judgement   ADL Overall ADL's : Needs assistance/impaired                     Lower Body Dressing: Moderate assistance;Sit to/from stand Lower Body Dressing Details (indicate cue type and reason): required assistance to lotion and don socks. Patient unable to perform figure four today due to pain. Toilet Transfer: Hydrographic surveyor Details (indicate cue type and reason): min guard with RW to transfer to [facility]. Patient initially requesting BSC due to urgency and then distance limited by pain. Toileting- Clothing Manipulation and Hygiene: Supervision/safety;Set up Toileting - Clothing Manipulation Details (indicate cue type and reason): supervision for toileting and therapist had to retrieve toilet paper for patient.            Extremity/Trunk Assessment  Vision Patient Visual Report: No change from baseline     Perception     Praxis      Cognition Arousal/Alertness: Awake/alert Behavior During Therapy: WFL for tasks assessed/performed Overall Cognitive Status: Within Functional Limits for tasks assessed                                            Exercises     Shoulder Instructions       General Comments      Pertinent Vitals/ Pain       Pain  Assessment: 0-10 Pain Score: 8  Pain Location: Low back Pain Descriptors / Indicators: Grimacing;Guarding;Sharp Pain Intervention(s): Monitored during session;Patient requesting pain meds-RN notified  Home Living                                          Prior Functioning/Environment              Frequency  Min 2X/week        Progress Toward Goals  OT Goals(current goals can now be found in the care plan section)  Progress towards OT goals: Progressing toward goals  Acute Rehab OT Goals Patient Stated Goal: get stronger OT Goal Formulation: With patient Time For Goal Achievement: 07/28/21 Potential to Achieve Goals: Good  Plan Discharge plan needs to be updated    Co-evaluation                 AM-PAC OT "6 Clicks" Daily Activity     Outcome Measure   Help from another person eating meals?: None Help from another person taking care of personal grooming?: None Help from another person toileting, which includes using toliet, bedpan, or urinal?: A Little Help from another person bathing (including washing, rinsing, drying)?: A Lot Help from another person to put on and taking off regular upper body clothing?: None Help from another person to put on and taking off regular lower body clothing?: A Lot 6 Click Score: 19    End of Session Equipment Utilized During Treatment: Rolling walker (2 wheels)  OT Visit Diagnosis: Unsteadiness on feet (R26.81);Pain;Dizziness and giddiness (R42)   Activity Tolerance Patient limited by pain   Patient Left with call bell/phone within reach;in bed   Nurse Communication Mobility status        Time: 4259-5638 OT Time Calculation (min): 29 min  Charges: OT General Charges $OT Visit: 1 Visit OT Treatments $Self Care/Home Management : 8-22 mins $Therapeutic Activity: 8-22 mins  Christina Wyatt, OTR/L Acute Care Rehab Services  Office [phone] Pager: [phone]   Christina Wyatt 07/18/2021, 3:43 PM

## 2021-07-19 LAB — RESP PANEL BY RT-PCR (FLU A&B, COVID) ARPGX2
Influenza A by PCR: NEGATIVE
Influenza B by PCR: NEGATIVE
SARS Coronavirus 2 by RT PCR: NEGATIVE

## 2021-07-19 LAB — CK: Total CK: 46 U/L (ref 38–234)

## 2021-07-19 MED ORDER — DAPTOMYCIN IV (FOR PTA / DISCHARGE USE ONLY): 500.0000 mg | INTRAVENOUS | 0 refills | Status: AC

## 2021-07-19 MED ORDER — RIFAXIMIN 550 MG PO TABS: 550.0000 mg | ORAL_TABLET | Freq: Two times a day (BID) | ORAL | Status: AC

## 2021-07-19 MED ORDER — LACTULOSE 10 GM/15ML PO SOLN: 20.0000 g | Freq: Three times a day (TID) | ORAL | 0 refills | Status: AC

## 2021-07-19 MED ORDER — PROSOURCE PLUS PO LIQD: 30.0000 mL | Freq: Three times a day (TID) | ORAL | Status: AC

## 2021-07-19 NOTE — TOC Progression Note (Signed)
Transition of Care [facility]) - Progression Note    Patient Details  Name: Christina Wyatt MRN: [id number] Date of Birth: Dec 24, 1965  [facility] CM/SW Contact  Darleene Cleaver, [place] Phone Number: 07/19/2021, 5:31 PM  Clinical Narrative:     CSW was informed by [facility] Lead Sharol Roussel, that [facility] can accept patient tonight and they can accept the LOG in the morning.   Expected Discharge Plan: Skilled Nursing Facility Barriers to Discharge: Barriers Resolved  Expected Discharge Plan and Services Expected Discharge Plan: Skilled Nursing Facility   Discharge Planning Services: CM Consult   Living arrangements for the past 2 months: Skilled Nursing Facility [facility]) Expected Discharge Date: 07/19/21                                     Social Determinants of Health (SDOH) Interventions    Readmission Risk Interventions No flowsheet data found.

## 2021-07-19 NOTE — Progress Notes (Signed)
Physical Therapy Treatment Patient Details Name: Christina Wyatt MRN: [id number] DOB: [birth year] Today's Date: 07/19/2021   History of Present Illness 55 y.o. female admitted on 07/10/21  w/ back pain and stomach pain.  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 H/O osteo/discitis C 5-6. Pt with medical history significant of Hepatitis C, bipolar d/o; polysubstance abuse.    PT Comments    Pt making gradual progress but remains limited by pain (ambulated 35' but unable to do more).  Required cues for safety and back precautions with transfers.   Continue plan of care with return to SNF for PT and antibiotics.   Recommendations for follow up therapy are one component of a multi-disciplinary discharge planning process, led by the attending physician.  Recommendations may be updated based on patient status, additional functional criteria and insurance authorization.  Follow Up Recommendations  Skilled nursing-short term rehab (<3 hours/day)     Assistance Recommended at Discharge    Equipment Recommendations  None recommended by PT    Recommendations for Other Services       Precautions / Restrictions Precautions Precautions: Back Precaution Comments: Reviewed log rolling, no bend or twist     Mobility  Bed Mobility Overal bed mobility: Needs Assistance Bed Mobility: Sidelying to Sit;Sit to Sidelying   Sidelying to sit: Supervision;HOB elevated     Sit to sidelying: Supervision;HOB elevated General bed mobility comments: No physical assist but min cues for log roll technique    Transfers Overall transfer level: Needs assistance Equipment used: None Transfers: Sit to/from Stand Sit to Stand: Min guard           General transfer comment: Min guard for safety; performed x 3 during session; required assist with toielting ADLs due to pain    Ambulation/Gait Ambulation/Gait assistance: Min  assist Gait Distance (Feet): 35 Feet Assistive device: Rolling walker (2 wheels) Gait Pattern/deviations: Step-to pattern;Decreased stride length Gait velocity: decr     General Gait Details: Slow gait with intermittet breaks due to back pain; min A to steady   Optometrist    Modified Rankin (Stroke Patients Only)       Balance Overall balance assessment: Needs assistance Sitting-balance support: Feet supported;No upper extremity supported Sitting balance-Leahy Scale: Good     Standing balance support: No upper extremity supported;During functional activity;Bilateral upper extremity supported Standing balance-Leahy Scale: Fair Standing balance comment: Pt could stand without UE support but prefers for pain control                            Cognition Arousal/Alertness: Awake/alert Behavior During Therapy: WFL for tasks assessed/performed Overall Cognitive Status: Within Functional Limits for tasks assessed                                          Exercises      General Comments        Pertinent Vitals/Pain Pain Assessment: 0-10 Pain Score: 8  Pain Location: Low back Pain Descriptors / Indicators: Grimacing;Guarding;Sharp Pain Intervention(s): Limited activity within patient's tolerance;Monitored during session;Premedicated before session    Home Living  Prior Function            PT Goals (current goals can now be found in the care plan section) Progress towards PT goals: Progressing toward goals    Frequency    Min 2X/week      PT Plan Current plan remains appropriate    Co-evaluation              AM-PAC PT "6 Clicks" Mobility   Outcome Measure  Help needed turning from your back to your side while in a flat bed without using bedrails?: A Little Help needed moving from lying on your back to sitting on the side of a flat bed without using  bedrails?: A Little Help needed moving to and from a bed to a chair (including a wheelchair)?: A Little Help needed standing up from a chair using your arms (e.g., wheelchair or bedside chair)?: A Little Help needed to walk in hospital room?: A Lot Help needed climbing 3-5 steps with a railing? : A Lot 6 Click Score: 16    End of Session Equipment Utilized During Treatment: Gait belt Activity Tolerance: Patient limited by pain (Further exercises or ambulation limited due to pain) Patient left: in bed;with call bell/phone within reach;with bed alarm set Nurse Communication: Mobility status PT Visit Diagnosis: Difficulty in walking, not elsewhere classified (R26.2);Pain     Time: 7680-8811 PT Time Calculation (min) (ACUTE ONLY): 15 min  Charges:  $Gait Training: 8-22 mins                     Anise Salvo, PT Acute Rehab Services Pager [phone] [facility] [phone]    Rayetta Humphrey 07/19/2021, 2:24 PM

## 2021-07-19 NOTE — TOC Transition Note (Addendum)
Transition of Care [facility]) - CM/SW Discharge Note   Patient Details  Name: Christina Wyatt MRN: [id number] Date of Birth: 1966/04/13  [facility] CM/SW Contact:  Ida Rogue, LCSW Phone Number: 07/19/2021, 4:18 PM   Clinical Narrative:  Patient who is medically stable for d/c will return to [facility] today.  However, a few barriers still remain.  The letter or guarantee needs to be signed by leadership and emailed to facility.  Have been unable to confirm at time of this writing that it is completed, except to hear from facility they have not yet received it.  Facility states they will not accept patient after 8PM.  Unit secretary knows to not send if PTAR arrives after 7:30 for pick up. Nursing, please call report to [phone]. Room 3217. TOC will continue to follow during the course of hospitalization.  Addendum: No LOG today. Will transfer tomorrow     Final next level of care: Skilled Nursing Facility Barriers to Discharge: Barriers Resolved   Patient Goals and CMS Choice        Discharge Placement                       Discharge Plan and Services   Discharge Planning Services: CM Consult                                 Social Determinants of Health (SDOH) Interventions     Readmission Risk Interventions No flowsheet data found.

## 2021-07-19 NOTE — Discharge Summary (Signed)
Physician Discharge Summary  Sheketa Ende MPN:[id number] DOB: 1966/08/04 DOA: 07/10/2021  PCP: Wenda Low, MD  Admit date: 07/10/2021 Discharge date: 07/19/2021  Admitted From: SNF Disposition:  SNF.   Recommendations for Outpatient Follow-up:  Follow up with PCP in 1-2 weeks Please obtain BMP/CBC in one week Please follow up with palliative care as outpatient.  Please follow up with ID as recommended.   Discharge Condition: gUARDED.  CODE STATUS: DNR Diet recommendation: Heart Healthy   Brief/Interim Summary:  Patient 55 year old female history of hep C, polysubstance abuse, bipolar disorder, cirrhosis, who was admitted to [facility] on 11/5 with worsening abdominal pain, lower extremity swelling, decreased appetite, worsening chronic back pain with subjective fevers and chills as well as shortness of breath and tachypnea.  Patient admitted at [facility] hospital with concerns for sepsis noted to have a bacteremia which grew MRSA, patient underwent MRI of the C-spine which showed discitis and osteomyelitis at C5, C6-C7, epidural thickening from C5-C6 disc space down to near C7 and T1 concerning for epidural abscess.  Patient underwent a 2D echo, TEE negative for endocarditis.  Admission at that time complicated with a thrombosed basilic vein on the left as well as phlebitis on the right with some purulence.  Patient also noted to have severe cirrhosis of the liver presumably from hep C and alcohol abuse.    ID consulted patient placed on IV vancomycin initially and changed back to IV daptomycin per ID recommendations.  Patient seen by neurosurgery and feel no surgical intervention needed at this time.  GI consulted to help optimize cirrhosis.   Pt stable for discharge to SNF when bed becomes available.   Discharge Diagnoses:  Principal Problem:   Diskitis Active Problems:   IVDU (intravenous drug user)   Chronic hepatitis C ([place])   MRSA bacteremia    Other cirrhosis of liver (HCC)   Anemia   Ascites of liver   AKI (acute kidney injury) ([place])   Pancytopenia (HCC)   Cirrhosis of liver with ascites (HCC)   Osteomyelitis of cervical spine (HCC)   Protein-calorie malnutrition, severe   Pressure injury of skin  C6- C7 discitis/ osteomyelitis/ New L1 L2 discitis with pathological fracture, paraspinal epidural inflammatory changes.  With probable abscess of the right psoas muscle.  Sepsis ruled out.  ID on board and appreciate recommendations.  NS contacted and no intervention needed at this time.      MRSA Bacteremia:  No vegetation on echocardiogram.  Resume IV daptomycin.  ID following. Plan to continue the course of IV daptomcin till JAN 7th, 2022.  Will need outpatient follow up with ID upon discharge.     Cirrhosis with ascites / Hepatitis C/ Hyperbilirubinemia:  US paracentesis 07/16/21 and 2.9 lit fluid taken out.pt feels comfortable. But abd still distended. Continue with lactulose twice daily, xifaxan, lasix, spironolactone. Slowly increasing the doses of meds as the patient's renal parameters have improved.  GI on board and appreciate recommendations.      IV drug abuse and alcohol abuse.  No withdrawal symptoms Resume suboxone 2 tab BID.  Resume pain meds as needed.  Recommend outpatient palliative care follow up.     AKI:  Creatinine is back to baseline at this time.     Anemia:  S/p 2 units of prbc transfusion.  Anemia of chronic disease;  Transfuse to keep hemoglobin greater than 7.  Hemoglobin around 8.3.       Pancytopenia probably secondary to liver dysfunction. Continue to  monitor.     Hyponatremia Sodium stable around 128. Probably secondary to hypervolemic hyponatremia, Improving .  Am cortisol wnl.  Serum osmo 277. TSH Slightly elevated at 5.067.     Pressure Injury: Pressure Injury 07/17/21 Sacrum Mid Stage 1 -  Intact skin with non-blanchable redness of a localized area usually over a  bony prominence. Pink intact skin (Active)  07/17/21 2030  Location: Sacrum  Location Orientation: Mid  Staging: Stage 1 -  Intact skin with non-blanchable redness of a localized area usually over a bony prominence.  Wound Description (Comments): Pink intact skin  Present on Admission:    Continue with local wound care.           Discharge Instructions  Discharge Instructions     [facility] pharmacist to adjust dose for Vancomycin, Aminoglycosides and other anti-infective therapies as requested by physician.   Complete by: As directed    [facility] to provide Cath Flo 70m   Complete by: As directed    Administer for PICC line occlusion and as ordered by physician for other access device issues.   Anaphylaxis Kit: Provided to treat any anaphylactic reaction to the medication being provided to the patient if First Dose or when requested by physician   Complete by: As directed    Epinephrine 137mml vial / amp: Administer 0.29m70m0.29ml52mubcutaneously once for moderate to severe anaphylaxis, nurse to call physician and pharmacy when reaction occurs and call 911 if needed for immediate care   Diphenhydramine 50mg32mIV vial: Administer 25-50mg 20mM PRN for first dose reaction, rash, itching, mild reaction, nurse to call physician and pharmacy when reaction occurs   Sodium Chloride 0.9% NS 500ml I429mdminister if needed for hypovolemic blood pressure drop or as ordered by physician after call to physician with anaphylactic reaction   Change dressing on IV access line weekly and PRN   Complete by: As directed    Diet - low sodium heart healthy   Complete by: As directed    Discharge instructions   Complete by: As directed    Please follow up with PCP in one week.  [facility]   Discharge wound care:   Complete by: As directed    Foam dressing.   Flush IV access with Sodium Chloride 0.9% and Heparin 10  units/ml or 100 units/ml   Complete by: As directed    Home infusion instructions - [facility]   Complete by: As directed    Instructions: Flush IV access with Sodium Chloride 0.9% and Heparin 10units/ml or 100units/ml   Change dressing on IV access line: Weekly and PRN   Instructions Cath Flo 2mg: Ad229mister for PICC Line occlusion and as ordered by physician for other access device   [facility] pharmacist to adjust dose for: Vancomycin, Aminoglycosides and other anti-infective therapies as requested by physician   Increase activity slowly   Complete by: As directed    Method of administration may be changed at the discretion of home infusion pharmacist based upon assessment of the patient and/or caregiver's ability to self-administer the medication ordered   Complete by: As directed    Outpatient Parenteral Antibiotic Therapy Information Antibiotic: Daptomycin (Cubicin) IVPB; Indications for use: discitis; End Date: 08/21/2021   Complete by: As directed    Antibiotic: Daptomycin (Cubicin) IVPB   Indications for use: discitis   End Date: 08/21/2021      Allergies as  of 07/19/2021   No Known Allergies      Medication List     STOP taking these medications    celecoxib 200 MG capsule Commonly known as: CELEBREX   DAPTOmycin 500 MG injection Commonly known as: CUBICIN Replaced by: daptomycin  IVPB   naloxone 4 MG/0.1ML Liqd nasal spray kit Commonly known as: NARCAN       TAKE these medications    (feeding supplement) PROSource Plus liquid Take 30 mLs by mouth 3 (three) times daily.   bisacodyl 5 MG EC tablet Commonly known as: DULCOLAX Take 5 mg by mouth daily as needed for moderate constipation.   buprenorphine-naloxone 2-0.5 mg Subl SL tablet Commonly known as: SUBOXONE Place 2 tablets under the tongue 2 (two) times daily.   daptomycin  IVPB Commonly known as: CUBICIN Inject 500 mg into the vein daily. Indication:  discitis First Dose:  No Last Day of Therapy:  08/21/2021 Labs - Once weekly:  CBC/D, BMP, and CPK Labs - Every other week:  ESR and CRP Method of administration: IV Push Method of administration may be changed at the discretion of home infusion pharmacist based upon assessment of the patient and/or caregiver's ability to self-administer the medication ordered. Replaces: DAPTOmycin 500 MG injection   Dermacloud Oint Apply 1 application topically 2 (two) times daily.   furosemide 20 MG tablet Commonly known as: LASIX Take 60 mg by mouth daily.   gabapentin 400 MG capsule Commonly known as: NEURONTIN Take 400 mg by mouth 3 (three) times daily.   HM Lidocaine Patch 4 % Ptch Generic drug: Lidocaine Apply 2 patches topically daily.   lactulose 10 GM/15ML solution Commonly known as: CHRONULAC Take 30 mLs (20 g total) by mouth 3 (three) times daily.   melatonin 3 MG Tabs tablet Take 3 mg by mouth at bedtime as needed (for sleep).   methocarbamol 500 MG tablet Commonly known as: ROBAXIN Take 500 mg by mouth 4 (four) times daily.   multivitamin with minerals tablet Take 1 tablet by mouth daily.   polyethylene glycol powder 17 GM/SCOOP powder Commonly known as: GLYCOLAX/MIRALAX Take 1 Container by mouth See admin instructions. Mix 17g (1 scoop) with 6 to 8 ounces of beverage and drink daily   rifaximin 550 MG Tabs tablet Commonly known as: XIFAXAN Take 1 tablet (550 mg total) by mouth 2 (two) times daily.   senna-docusate 8.6-50 MG tablet Commonly known as: Senokot-S Take 1 tablet by mouth 2 (two) times daily.   spironolactone 100 MG tablet Commonly known as: ALDACTONE Take 200 mg by mouth daily.   Voltaren 1 % Gel Generic drug: diclofenac Sodium Apply 4 g topically 2 (two) times daily.               Discharge Care Instructions  (From admission, onward)           Start     Ordered   07/19/21 0000  Change dressing on IV access line weekly and PRN  (Home infusion instructions -  [facility] )        07/19/21 1518   07/19/21 0000  Discharge wound care:       Comments: Foam dressing.   07/19/21 1518            No Known Allergies  Consultations: ID.  Palliative care    Procedures/Studies: DG Abd 1 View  Result Date: 07/14/2021 CLINICAL DATA:  Abdominal pain and distension EXAM: ABDOMEN - 1 VIEW COMPARISON:  None. FINDINGS: Mild amount  of retained stool in the colon. Nonobstructive bowel gas pattern. Stomach mildly distended. No urinary tract calcifications. No acute skeletal abnormality. IMPRESSION: Nonobstructive bowel gas pattern. Mild amount of stool in the colon. Mildly distended stomach. Electronically Signed   By: Franchot Gallo M.D.   On: 07/14/2021 20:36   MR Cervical Spine W or Wo Contrast  Result Date: 07/11/2021 CLINICAL DATA:  Mid to low back pain. Reported findings of discitis/osteomyelitis on outside MRI 06/26/2021. history of drug abuse. EXAM: MRI CERVICAL, THORACIC AND LUMBAR SPINE WITHOUT AND WITH CONTRAST TECHNIQUE: Multiplanar and multiecho pulse sequences of the cervical spine, to include the craniocervical junction and cervicothoracic junction, and thoracic and lumbar spine, were obtained without and with intravenous contrast. CONTRAST:  73m GADAVIST GADOBUTROL 1 MMOL/ML IV SOLN COMPARISON:  Portable chest radiograph same date. No previous outside imaging or reports available. FINDINGS: MRI CERVICAL SPINE FINDINGS Alignment: Straightening without focal angulation or significant listhesis. Vertebrae: There is mild T2 hyperintensity within the C6-7 disc. There is adjacent endplate T2 hyperintensity, T1 hypointensity and diffuse marrow enhancement within the C6 and C7 vertebral bodies consistent with osteomyelitis. No significant pathologic fracture. No other significant marrow signal abnormalities in the cervical spine. Cord: Normal in signal and caliber. No abnormal intradural enhancement. Posterior Fossa, vertebral arteries,  paraspinal tissues: The visualized craniocervical junction appears normal. There are bilateral vertebral artery flow voids. There are anterior paraspinal inflammatory changes within the prevertebral soft tissues at C6-7 without focal fluid collection. No epidural fluid collections are seen. Disc levels: Multilevel cervical spondylosis with disc bulging, uncinate spurring and facet hypertrophy. There is no cord compression, although multilevel mild-to-moderate foraminal narrowing is present from C3-4 through C6-7. As above, findings of discitis and early osteomyelitis at C6-7 without associated epidural fluid collection. There are anterior paraspinal inflammatory changes with heterogeneous enhancement, but no focal fluid collection. MRI THORACIC SPINE FINDINGS Alignment:  Normal. Vertebrae: No evidence of acute fracture, discitis or osteomyelitis within the thoracic spine. Cord: Normal in signal and caliber. No abnormal intradural enhancement. Paraspinal and other soft tissues: The thoracic paraspinal soft tissues appear unremarkable. Trace bilateral pleural effusions. Patchy pulmonary opacities are noted, suboptimally evaluated by MRI, but likely reflecting atelectasis or edema based on earlier chest radiographs. Disc levels: The thoracic discs appear normal. No disc herniation, spinal stenosis or nerve root encroachment identified in the thoracic spine. MRI LUMBAR SPINE FINDINGS Segmentation:  There are 5 lumbar type vertebral bodies. Alignment: Trace retrolisthesis at L1-2 and anterolisthesis at L5-S1. Vertebrae: There are changes of advanced discitis and osteomyelitis at L1-2. There is endplate destruction, loss of vertebral body height and diffuse marrow enhancement throughout the L1 and L2 vertebral bodies. The intervening disc demonstrates heterogeneous T2 hyperintensity and enhancement. No additional levels of discitis/osteomyelitis identified. No definite infected facet joints are seen. Conus medullaris and  cauda equina: Conus extends to the L1-2 level. There is mass effect on the thecal sac by anterior epidural inflammation at L1-2, but no gross conus compression or abnormal intradural enhancement. Paraspinal and other soft tissues: Extensive paraspinal inflammatory changes at L1-2 with a small peripherally enhancing fluid collection within the right psoas muscle adjacent to the L1 vertebral body, measuring approximately 2.2 x 0.8 x 1.5 cm. No other focal paraspinal fluid collections are identified. There is anterior epidural inflammation at L1-2 extending into both foramina. No epidural fluid collection identified. The spleen is incompletely imaged, although appears moderately enlarged. There is generalized soft tissue edema with evidence of at least moderate ascites. Disc levels: T12-L1:  Normal interspace. L1-2: Advanced changes of diskitis and osteomyelitis with annular disc bulging and extensive anterior epidural inflammation extending into both foramina. There is moderate mass effect on the thecal sac with moderate foraminal narrowing bilaterally, worse on the left. L2-3: Normal interspace. L3-4: Normal interspace. L4-5: Disc height and hydration are maintained. Mild facet and ligamentous hypertrophy. No significant spinal stenosis. L5-S1: Mild disc bulging with endplate osteophytes asymmetric to the right. Mild bilateral facet hypertrophy. Mild right foraminal narrowing. IMPRESSION: 1. Findings consistent with discitis and osteomyelitis at C6-7. No associated cervical paraspinal or epidural abscess or mass effect on the cord. 2. 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 There is a small probable abscess within the right psoas muscle and mass effect on the thecal sac and both neural foramina. No cord compression or drainable epidural fluid collections identified. 3. No significant thoracic spine findings. 4.  Multilevel cervical spondylosis as described with foraminal narrowing bilaterally from C3-4 through C6-7 due to spondylosis. 5. Anasarca with generalized soft tissue edema, ascites and small bilateral pleural effusions. Electronically Signed   By: Richardean Sale M.D.   On: 07/11/2021 10:43   MR THORACIC SPINE W WO CONTRAST  Result Date: 07/11/2021 CLINICAL DATA:  Mid to low back pain. Reported findings of discitis/osteomyelitis on outside MRI 06/26/2021. history of drug abuse. EXAM: MRI CERVICAL, THORACIC AND LUMBAR SPINE WITHOUT AND WITH CONTRAST TECHNIQUE: Multiplanar and multiecho pulse sequences of the cervical spine, to include the craniocervical junction and cervicothoracic junction, and thoracic and lumbar spine, were obtained without and with intravenous contrast. CONTRAST:  68m GADAVIST GADOBUTROL 1 MMOL/ML IV SOLN COMPARISON:  Portable chest radiograph same date. No previous outside imaging or reports available. FINDINGS: MRI CERVICAL SPINE FINDINGS Alignment: Straightening without focal angulation or significant listhesis. Vertebrae: There is mild T2 hyperintensity within the C6-7 disc. There is adjacent endplate T2 hyperintensity, T1 hypointensity and diffuse marrow enhancement within the C6 and C7 vertebral bodies consistent with osteomyelitis. No significant pathologic fracture. No other significant marrow signal abnormalities in the cervical spine. Cord: Normal in signal and caliber. No abnormal intradural enhancement. Posterior Fossa, vertebral arteries, paraspinal tissues: The visualized craniocervical junction appears normal. There are bilateral vertebral artery flow voids. There are anterior paraspinal inflammatory changes within the prevertebral soft tissues at C6-7 without focal fluid collection. No epidural fluid collections are seen. Disc levels: Multilevel cervical spondylosis with disc bulging, uncinate spurring and facet hypertrophy. There is no cord compression, although multilevel  mild-to-moderate foraminal narrowing is present from C3-4 through C6-7. As above, findings of discitis and early osteomyelitis at C6-7 without associated epidural fluid collection. There are anterior paraspinal inflammatory changes with heterogeneous enhancement, but no focal fluid collection. MRI THORACIC SPINE FINDINGS Alignment:  Normal. Vertebrae: No evidence of acute fracture, discitis or osteomyelitis within the thoracic spine. Cord: Normal in signal and caliber. No abnormal intradural enhancement. Paraspinal and other soft tissues: The thoracic paraspinal soft tissues appear unremarkable. Trace bilateral pleural effusions. Patchy pulmonary opacities are noted, suboptimally evaluated by MRI, but likely reflecting atelectasis or edema based on earlier chest radiographs. Disc levels: The thoracic discs appear normal. No disc herniation, spinal stenosis or nerve root encroachment identified in the thoracic spine. MRI LUMBAR SPINE FINDINGS Segmentation:  There are 5 lumbar type vertebral bodies. Alignment: Trace retrolisthesis at L1-2 and anterolisthesis at L5-S1. Vertebrae: There are changes of advanced discitis and osteomyelitis at L1-2. There is endplate destruction, loss of vertebral body height and  diffuse marrow enhancement throughout the L1 and L2 vertebral bodies. The intervening disc demonstrates heterogeneous T2 hyperintensity and enhancement. No additional levels of discitis/osteomyelitis identified. No definite infected facet joints are seen. Conus medullaris and cauda equina: Conus extends to the L1-2 level. There is mass effect on the thecal sac by anterior epidural inflammation at L1-2, but no gross conus compression or abnormal intradural enhancement. Paraspinal and other soft tissues: Extensive paraspinal inflammatory changes at L1-2 with a small peripherally enhancing fluid collection within the right psoas muscle adjacent to the L1 vertebral body, measuring approximately 2.2 x 0.8 x 1.5 cm. No  other focal paraspinal fluid collections are identified. There is anterior epidural inflammation at L1-2 extending into both foramina. No epidural fluid collection identified. The spleen is incompletely imaged, although appears moderately enlarged. There is generalized soft tissue edema with evidence of at least moderate ascites. Disc levels: T12-L1: Normal interspace. L1-2: Advanced changes of diskitis and osteomyelitis with annular disc bulging and extensive anterior epidural inflammation extending into both foramina. There is moderate mass effect on the thecal sac with moderate foraminal narrowing bilaterally, worse on the left. L2-3: Normal interspace. L3-4: Normal interspace. L4-5: Disc height and hydration are maintained. Mild facet and ligamentous hypertrophy. No significant spinal stenosis. L5-S1: Mild disc bulging with endplate osteophytes asymmetric to the right. Mild bilateral facet hypertrophy. Mild right foraminal narrowing. IMPRESSION: 1. Findings consistent with discitis and osteomyelitis at C6-7. No associated cervical paraspinal or epidural abscess or mass effect on the cord. 2. 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 There is a small probable abscess within the right psoas muscle and mass effect on the thecal sac and both neural foramina. No cord compression or drainable epidural fluid collections identified. 3. No significant thoracic spine findings. 4. Multilevel cervical spondylosis as described with foraminal narrowing bilaterally from C3-4 through C6-7 due to spondylosis. 5. Anasarca with generalized soft tissue edema, ascites and small bilateral pleural effusions. Electronically Signed   By: Richardean Sale M.D.   On: 07/11/2021 10:43   MR Lumbar Spine W Wo Contrast  Result Date: 07/11/2021 CLINICAL DATA:  Mid to low back pain. Reported findings of discitis/osteomyelitis on outside MRI  06/26/2021. history of drug abuse. EXAM: MRI CERVICAL, THORACIC AND LUMBAR SPINE WITHOUT AND WITH CONTRAST TECHNIQUE: Multiplanar and multiecho pulse sequences of the cervical spine, to include the craniocervical junction and cervicothoracic junction, and thoracic and lumbar spine, were obtained without and with intravenous contrast. CONTRAST:  10m GADAVIST GADOBUTROL 1 MMOL/ML IV SOLN COMPARISON:  Portable chest radiograph same date. No previous outside imaging or reports available. FINDINGS: MRI CERVICAL SPINE FINDINGS Alignment: Straightening without focal angulation or significant listhesis. Vertebrae: There is mild T2 hyperintensity within the C6-7 disc. There is adjacent endplate T2 hyperintensity, T1 hypointensity and diffuse marrow enhancement within the C6 and C7 vertebral bodies consistent with osteomyelitis. No significant pathologic fracture. No other significant marrow signal abnormalities in the cervical spine. Cord: Normal in signal and caliber. No abnormal intradural enhancement. Posterior Fossa, vertebral arteries, paraspinal tissues: The visualized craniocervical junction appears normal. There are bilateral vertebral artery flow voids. There are anterior paraspinal inflammatory changes within the prevertebral soft tissues at C6-7 without focal fluid collection. No epidural fluid collections are seen. Disc levels: Multilevel cervical spondylosis with disc bulging, uncinate spurring and facet hypertrophy. There is no cord compression, although multilevel mild-to-moderate foraminal narrowing is present from C3-4 through C6-7. As above, findings of discitis and early  osteomyelitis at C6-7 without associated epidural fluid collection. There are anterior paraspinal inflammatory changes with heterogeneous enhancement, but no focal fluid collection. MRI THORACIC SPINE FINDINGS Alignment:  Normal. Vertebrae: No evidence of acute fracture, discitis or osteomyelitis within the thoracic spine. Cord: Normal in  signal and caliber. No abnormal intradural enhancement. Paraspinal and other soft tissues: The thoracic paraspinal soft tissues appear unremarkable. Trace bilateral pleural effusions. Patchy pulmonary opacities are noted, suboptimally evaluated by MRI, but likely reflecting atelectasis or edema based on earlier chest radiographs. Disc levels: The thoracic discs appear normal. No disc herniation, spinal stenosis or nerve root encroachment identified in the thoracic spine. MRI LUMBAR SPINE FINDINGS Segmentation:  There are 5 lumbar type vertebral bodies. Alignment: Trace retrolisthesis at L1-2 and anterolisthesis at L5-S1. Vertebrae: There are changes of advanced discitis and osteomyelitis at L1-2. There is endplate destruction, loss of vertebral body height and diffuse marrow enhancement throughout the L1 and L2 vertebral bodies. The intervening disc demonstrates heterogeneous T2 hyperintensity and enhancement. No additional levels of discitis/osteomyelitis identified. No definite infected facet joints are seen. Conus medullaris and cauda equina: Conus extends to the L1-2 level. There is mass effect on the thecal sac by anterior epidural inflammation at L1-2, but no gross conus compression or abnormal intradural enhancement. Paraspinal and other soft tissues: Extensive paraspinal inflammatory changes at L1-2 with a small peripherally enhancing fluid collection within the right psoas muscle adjacent to the L1 vertebral body, measuring approximately 2.2 x 0.8 x 1.5 cm. No other focal paraspinal fluid collections are identified. There is anterior epidural inflammation at L1-2 extending into both foramina. No epidural fluid collection identified. The spleen is incompletely imaged, although appears moderately enlarged. There is generalized soft tissue edema with evidence of at least moderate ascites. Disc levels: T12-L1: Normal interspace. L1-2: Advanced changes of diskitis and osteomyelitis with annular disc bulging and  extensive anterior epidural inflammation extending into both foramina. There is moderate mass effect on the thecal sac with moderate foraminal narrowing bilaterally, worse on the left. L2-3: Normal interspace. L3-4: Normal interspace. L4-5: Disc height and hydration are maintained. Mild facet and ligamentous hypertrophy. No significant spinal stenosis. L5-S1: Mild disc bulging with endplate osteophytes asymmetric to the right. Mild bilateral facet hypertrophy. Mild right foraminal narrowing. IMPRESSION: 1. Findings consistent with discitis and osteomyelitis at C6-7. No associated cervical paraspinal or epidural abscess or mass effect on the cord. 2. 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 There is a small probable abscess within the right psoas muscle and mass effect on the thecal sac and both neural foramina. No cord compression or drainable epidural fluid collections identified. 3. No significant thoracic spine findings. 4. Multilevel cervical spondylosis as described with foraminal narrowing bilaterally from C3-4 through C6-7 due to spondylosis. 5. Anasarca with generalized soft tissue edema, ascites and small bilateral pleural effusions. Electronically Signed   By: Richardean Sale M.D.   On: 07/11/2021 10:43   US Abdomen Complete  Result Date: 07/11/2021 CLINICAL DATA:  Liver failure.  Cirrhosis. EXAM: ABDOMEN ULTRASOUND COMPLETE COMPARISON:  None. FINDINGS: Gallbladder: No gallstones or wall thickening visualized. No sonographic Murphy sign noted by sonographer. Common bile duct: Diameter: 5 mm Liver: Cirrhosis. Portal vein is patent on color Doppler imaging with normal direction of blood flow towards the liver. IVC: No abnormality visualized. Pancreas: Visualized portion unremarkable. Spleen: Splenomegaly measuring 19 cm in length. Right Kidney: Length: 10 cm. Echogenicity within normal limits. No mass or  hydronephrosis visualized. Left Kidney: Length: 10 cm. Echogenicity within normal limits. No mass or hydronephrosis visualized. Abdominal aorta: No aneurysm visualized. Other findings: There is a small ascites. IMPRESSION: 1. Cirrhosis with evidence of portal hypertension, splenomegaly, and ascites. 2. Patent main portal vein with hepatopetal flow. Electronically Signed   By: Anner Crete M.D.   On: 07/11/2021 22:23   US Paracentesis  Result Date: 07/16/2021 INDICATION: Patient with history of hep C and cirrhosis with moderate volume ascites. Request for IR to perform therapeutic paracentesis. EXAM: ULTRASOUND GUIDED THERAPEUTIC PARACENTESIS MEDICATIONS: 71m 1% lidocaine COMPLICATIONS: None immediate. PROCEDURE: Informed written consent was obtained from the patient after a discussion of the risks, benefits and alternatives to treatment. A timeout was performed prior to the initiation of the procedure. Initial ultrasound scanning demonstrates a moderate amount of ascites within the left lower abdominal quadrant. The left lower abdomen was prepped and draped in the usual sterile fashion. 1% lidocaine was used for local anesthesia. Following this, a 19 gauge, 7-cm, Yueh catheter was introduced. An ultrasound image was saved for documentation purposes. The paracentesis was performed. The catheter was removed and a dressing was applied. The patient tolerated the procedure well without immediate post procedural complication. FINDINGS: A total of approximately 2.9 L of hazy, amber fluid was removed. IMPRESSION: Successful ultrasound-guided paracentesis yielding 2.9 liters of peritoneal fluid. Read by: SNarda Rutherford NP Electronically Signed   By: FMiachel RouxM.D.   On: 07/16/2021 16:04   DG Chest Port 1 View  Result Date: 07/11/2021 CLINICAL DATA:  Chronic lower back pain. EXAM: PORTABLE CHEST 1 VIEW COMPARISON:  None. FINDINGS: A right-sided venous catheter is seen with its distal tip noted at the junction  of the superior vena cava and right atrium. Mild linear atelectasis is present within the bilateral lung bases. There is no evidence of a pleural effusion or pneumothorax. The heart size and mediastinal contours are within normal limits. The visualized skeletal structures are unremarkable. IMPRESSION: 1. Right-sided venous catheter positioning, as described above. 2. Mild bibasilar linear atelectasis. Electronically Signed   By: TVirgina NorfolkM.D.   On: 07/11/2021 00:36   [place]CITES (ABDOMEN LIMITED)  Result Date: 07/13/2021 CLINICAL DATA:  Liver failure, cirrhosis, ascites EXAM: LIMITED ABDOMEN ULTRASOUND FOR ASCITES TECHNIQUE: Limited ultrasound survey for ascites was performed in all four abdominal quadrants. COMPARISON:  07/11/2021 FINDINGS: Small volume scattered abdominal ascites. No dominant large pocket. IMPRESSION: Small volume abdominal ascites.  Paracentesis deferred. Electronically Signed   By: DLucrezia EuropeM.D.   On: 07/13/2021 10:06   [place]KG SITE RITE  Result Date: 07/12/2021 If Site Rite image not attached, placement could not be confirmed due to current cardiac rhythm.     Subjective: No new complaints.  Discharge Exam: Vitals:   07/19/21 0425 07/19/21 1445  BP: 112/74 106/70  Pulse: 91 93  Resp: 20 18  Temp: 98.1 F (36.7 C) 98.5 F (36.9 C)  SpO2: 98% 95%   Vitals:   07/18/21 1625 07/18/21 1945 07/19/21 0425 07/19/21 1445  BP:  133/85 112/74 106/70  Pulse:  94 91 93  Resp: _0 Temp:  97.6 F (36.4 C) 98.1 F (36.7 C) 98.5 F (36.9 C)  TempSrc:  Oral Oral Oral  SpO2:  97% 98% 95%  Weight:      Height:        General: Pt is alert, awake, not in acute distress Cardiovascular: RRR, S1/S2 +, no rubs, no gallops Respiratory: CTA bilaterally, no  wheezing, no rhonchi Abdominal: Soft, NT, ND, bowel sounds + Extremities: no edema, no cyanosis    The results of significant diagnostics from this hospitalization (including imaging, microbiology,  ancillary and laboratory) are listed below for reference.     Microbiology: Recent Results (from the past 240 hour(s))  Blood Culture (routine x 2)     Status: None   Collection Time: 07/11/21  1:32 AM   Specimen: BLOOD  Result Value Ref Range Status   Specimen Description   Final    BLOOD BLOOD LEFT FOREARM Performed at [facility] [address] [place], [place] [address]    Special Requests   Final    BOTTLES DRAWN AEROBIC AND ANAEROBIC Blood Culture results may not be optimal due to an inadequate volume of blood received in culture bottles Performed at [facility] [address] [place], [place] [address]    Culture   Final    NO GROWTH 5 DAYS Performed at [facility], [place] [address] [place], [place] [address]    Report Status 07/16/2021 FINAL  Final  Blood Culture (routine x 2)     Status: None   Collection Time: 07/11/21  1:34 AM   Specimen: BLOOD  Result Value Ref Range Status   Specimen Description   Final    BLOOD BLOOD RIGHT FOREARM Performed at [facility] [address] [place], [place] [address]    Special Requests   Final    BOTTLES DRAWN AEROBIC AND ANAEROBIC Blood Culture adequate volume Performed at [facility] [address] [place], [place] [address]    Culture   Final    NO GROWTH 5 DAYS Performed at [facility], [place] [address], [place], [place] [address]    Report Status 07/16/2021 FINAL  Final  Resp Panel by RT-PCR (Flu A&B, Covid) Nasopharyngeal Swab     Status: None   Collection Time: 07/11/21 12:47 PM   Specimen: Nasopharyngeal Swab; Nasopharyngeal(NP) swabs in vial transport medium  Result Value Ref Range Status   SARS Coronavirus 2 by RT PCR NEGATIVE NEGATIVE Final    Comment: (NOTE) SARS-CoV-2 target nucleic acids are NOT DETECTED.  The SARS-CoV-2 RNA is generally detectable in upper respiratory specimens during the acute phase of  infection. The lowest concentration of SARS-CoV-2 viral copies this assay can detect is 138 copies/mL. A negative result does not preclude SARS-Cov-2 infection and should not be used as the sole basis for treatment or other patient management decisions. A negative result may occur with  improper specimen collection/handling, submission of specimen other than nasopharyngeal swab, presence of viral mutation(s) within the areas targeted by this assay, and inadequate number of viral copies(<138 copies/mL). A negative result must be combined with clinical observations, patient history, and epidemiological information. The expected result is Negative.  Fact Sheet for Patients:  EntrepreneurPulse.com.au  Fact Sheet for Healthcare Providers:  IncredibleEmployment.be  This test is no t yet approved or cleared by the [place] FDA and  has been authorized for detection and/or diagnosis of SARS-CoV-2 by FDA under an Emergency Use Authorization (EUA). This EUA will remain  in effect (meaning this test can be used) for the duration of the COVID-19 declaration under Section 564(b)(1) of the Act, 21 U.S.C.section 360bbb-3(b)(1), unless the authorization is terminated  or revoked sooner.       Influenza A by PCR NEGATIVE NEGATIVE Final   Influenza B by PCR NEGATIVE NEGATIVE Final  Comment: (NOTE) The Xpert Xpress SARS-CoV-2/FLU/RSV plus assay is intended as an aid in the diagnosis of influenza from Nasopharyngeal swab specimens and should not be used as a sole basis for treatment. Nasal washings and aspirates are unacceptable for Xpert Xpress SARS-CoV-2/FLU/RSV testing.  Fact Sheet for Patients: EntrepreneurPulse.com.au  Fact Sheet for Healthcare Providers: IncredibleEmployment.be  This test is not yet approved or cleared by the [place] FDA and has been authorized for detection and/or diagnosis of SARS-CoV-2  by FDA under an Emergency Use Authorization (EUA). This EUA will remain in effect (meaning this test can be used) for the duration of the COVID-19 declaration under Section 564(b)(1) of the Act, 21 U.S.C. section 360bbb-3(b)(1), unless the authorization is terminated or revoked.  Performed at [facility], [place] [address] [place], [place] [address]      Labs: BNP (last 3 results) No results for input(s): BNP in the last 8760 hours. Basic Metabolic Panel: Recent Labs  Lab 07/14/21 0523 07/15/21 1450 07/16/21 0930 07/17/21 1300 07/18/21 0301  NA 131* 128* 127* 129* 128*  K 4.3 3.9 3.9 3.9 3.5  CL 104 100 99 98 99  CO2 _0 GLUCOSE 108* 119* 118* 115* 137*  BUN 22* 21* 22* 18 18  CREATININE 0.94 0.87 0.79 0.81 0.87  CALCIUM 8.0* 7.5* 7.7* 7.8* 7.6*  MG 2.2  --   --   --   --    Liver Function Tests: Recent Labs  Lab 07/14/21 0523  AST 43*  ALT 21  ALKPHOS 62  BILITOT 2.0*  PROT 7.5  ALBUMIN 2.1*   No results for input(s): LIPASE, AMYLASE in the last 168 hours. No results for input(s): AMMONIA in the last 168 hours. CBC: Recent Labs  Lab 07/13/21 0016 07/14/21 0523 07/16/21 0930  WBC  --  3.0* 3.0*  NEUTROABS  --  1.8 1.8  HGB 9.0* 8.3* 8.3*  HCT 26.5* 25.0* 24.8*  MCV  --  97.7 96.1  PLT  --  135* 130*   Cardiac Enzymes: Recent Labs  Lab 07/19/21 0301  CKTOTAL 46   BNP: Invalid input(s): POCBNP CBG: No results for input(s): GLUCAP in the last 168 hours. D-Dimer No results for input(s): DDIMER in the last 72 hours. Hgb A1c No results for input(s): HGBA1C in the last 72 hours. Lipid Profile No results for input(s): CHOL, HDL, LDLCALC, TRIG, CHOLHDL, LDLDIRECT in the last 72 hours. Thyroid function studies Recent Labs    07/18/21 0301  TSH 5.067*   Anemia work up No results for input(s): VITAMINB12, FOLATE, FERRITIN, TIBC, IRON, RETICCTPCT in the last 72 hours. Urinalysis    Component Value Date/Time    COLORURINE YELLOW 07/11/2021 0108   APPEARANCEUR CLEAR 07/11/2021 0108   LABSPEC 1.018 07/11/2021 0108   PHURINE 7.0 07/11/2021 0108   GLUCOSEU NEGATIVE 07/11/2021 0108   HGBUR NEGATIVE 07/11/2021 0108   BILIRUBINUR NEGATIVE 07/11/2021 0108   KETONESUR NEGATIVE 07/11/2021 0108   PROTEINUR NEGATIVE 07/11/2021 0108   NITRITE NEGATIVE 07/11/2021 0108   LEUKOCYTESUR NEGATIVE 07/11/2021 0108   Sepsis Labs Invalid input(s): PROCALCITONIN,  WBC,  LACTICIDVEN Microbiology Recent Results (from the past 240 hour(s))  Blood Culture (routine x 2)     Status: None   Collection Time: 07/11/21  1:32 AM   Specimen: BLOOD  Result Value Ref Range Status   Specimen Description   Final    BLOOD BLOOD LEFT FOREARM Performed at [facility], [place] [address] [place],  [place] [address]    Special Requests   Final    BOTTLES DRAWN AEROBIC AND ANAEROBIC Blood Culture results may not be optimal due to an inadequate volume of blood received in culture bottles Performed at [facility], [place] [address], [place], [place] [address]    Culture   Final    NO GROWTH 5 DAYS Performed at [facility], [place] [address], [place], [place] [address]    Report Status 07/16/2021 FINAL  Final  Blood Culture (routine x 2)     Status: None   Collection Time: 07/11/21  1:34 AM   Specimen: BLOOD  Result Value Ref Range Status   Specimen Description   Final    BLOOD BLOOD RIGHT FOREARM Performed at [facility] [address], [place], [place] [address]    Special Requests   Final    BOTTLES DRAWN AEROBIC AND ANAEROBIC Blood Culture adequate volume Performed at [facility] [address] [place], [place] [address]    Culture   Final    NO GROWTH 5 DAYS Performed at [facility], [place] [address], [place], [place] [address]    Report Status 07/16/2021 FINAL  Final  Resp Panel by RT-PCR (Flu A&B, Covid) Nasopharyngeal Swab      Status: None   Collection Time: 07/11/21 12:47 PM   Specimen: Nasopharyngeal Swab; Nasopharyngeal(NP) swabs in vial transport medium  Result Value Ref Range Status   SARS Coronavirus 2 by RT PCR NEGATIVE NEGATIVE Final    Comment: (NOTE) SARS-CoV-2 target nucleic acids are NOT DETECTED.  The SARS-CoV-2 RNA is generally detectable in upper respiratory specimens during the acute phase of infection. The lowest concentration of SARS-CoV-2 viral copies this assay can detect is 138 copies/mL. A negative result does not preclude SARS-Cov-2 infection and should not be used as the sole basis for treatment or other patient management decisions. A negative result may occur with  improper specimen collection/handling, submission of specimen other than nasopharyngeal swab, presence of viral mutation(s) within the areas targeted by this assay, and inadequate number of viral copies(<138 copies/mL). A negative result must be combined with clinical observations, patient history, and epidemiological information. The expected result is Negative.  Fact Sheet for Patients:  EntrepreneurPulse.com.au  Fact Sheet for Healthcare Providers:  IncredibleEmployment.be  This test is no t yet approved or cleared by the [place] FDA and  has been authorized for detection and/or diagnosis of SARS-CoV-2 by FDA under an Emergency Use Authorization (EUA). This EUA will remain  in effect (meaning this test can be used) for the duration of the COVID-19 declaration under Section 564(b)(1) of the Act, 21 U.S.C.section 360bbb-3(b)(1), unless the authorization is terminated  or revoked sooner.       Influenza A by PCR NEGATIVE NEGATIVE Final   Influenza B by PCR NEGATIVE NEGATIVE Final    Comment: (NOTE) The Xpert Xpress SARS-CoV-2/FLU/RSV plus assay is intended as an aid in the diagnosis of influenza from Nasopharyngeal swab specimens and should not be used as a sole basis  for treatment. Nasal washings and aspirates are unacceptable for Xpert Xpress SARS-CoV-2/FLU/RSV testing.  Fact Sheet for Patients: EntrepreneurPulse.com.au  Fact Sheet for Healthcare Providers: IncredibleEmployment.be  This test is not yet approved or cleared by the [place] FDA and has been authorized for detection and/or diagnosis of SARS-CoV-2 by FDA under an Emergency Use Authorization (EUA). This EUA will remain in effect (meaning this test can be used) for the  duration of the COVID-19 declaration under Section 564(b)(1) of the Act, 21 U.S.C. section 360bbb-3(b)(1), unless the authorization is terminated or revoked.  Performed at [facility], [place] [address] [place], [place] [address]      Time coordinating discharge: 38 minutes.   SIGNED:   Hosie Poisson, MD  [facility] 07/19/2021, 3:19 PM

## 2021-08-05 ENCOUNTER — Other Ambulatory Visit: Payer: Self-pay | Admitting: Internal Medicine

## 2021-08-05 ENCOUNTER — Other Ambulatory Visit (HOSPITAL_COMMUNITY): Payer: Self-pay | Admitting: Internal Medicine

## 2021-08-05 DIAGNOSIS — K7460 Unspecified cirrhosis of liver: Secondary | ICD-10-CM

## 2021-08-05 DIAGNOSIS — R188 Other ascites: Secondary | ICD-10-CM

## 2021-08-10 ENCOUNTER — Ambulatory Visit (HOSPITAL_COMMUNITY)
Admission: RE | Admit: 2021-08-10 | Discharge: 2021-08-10 | Disposition: A | Discharge status: Home / Self Care | Payer: Self-pay | Source: Ambulatory Visit | Attending: Internal Medicine | Admitting: Internal Medicine

## 2021-08-10 ENCOUNTER — Other Ambulatory Visit: Payer: Self-pay

## 2021-08-10 DIAGNOSIS — R188 Other ascites: Secondary | ICD-10-CM | POA: Insufficient documentation

## 2021-08-10 DIAGNOSIS — K7460 Unspecified cirrhosis of liver: Secondary | ICD-10-CM | POA: Insufficient documentation

## 2021-08-10 IMAGING — US US PARACENTESIS
1 series · 10 of 10 positions shown · non-contrast
Comparison: none

INDICATION: History of cirrhosis and recurrent ascites. Request for therapeutic
paracentesis.

[Series 1: us paracentesis · 10 of 10 slices shown]
[im 1/10]
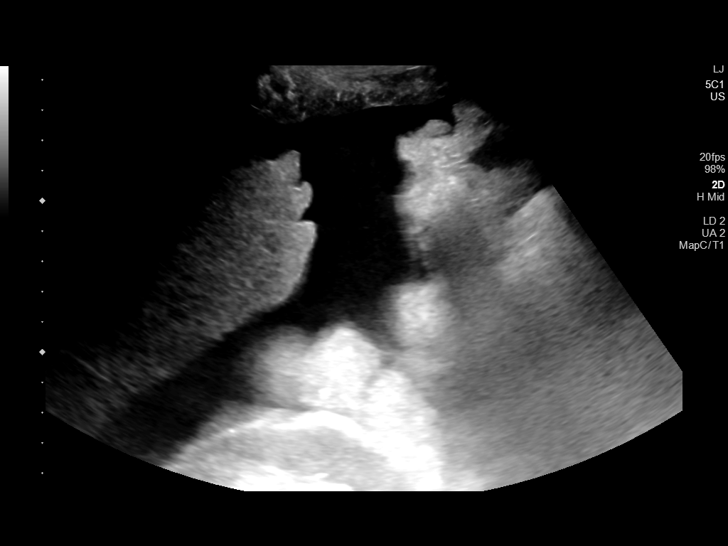
[im 2/10]
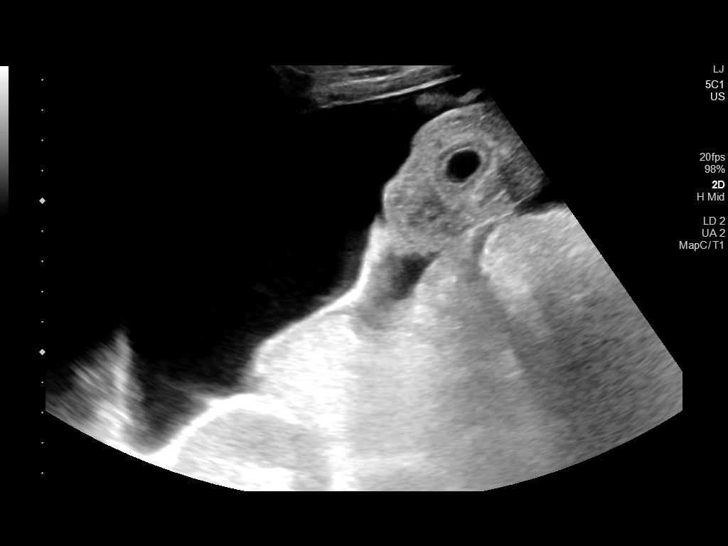
[im 3/10]
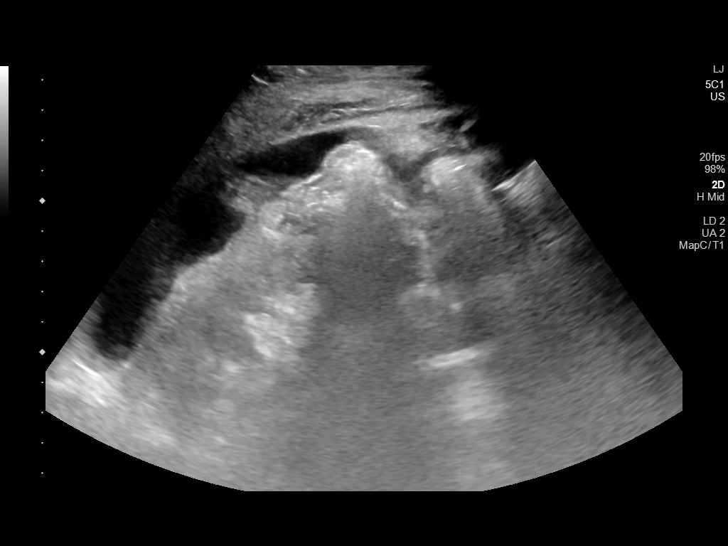
[im 4/10]
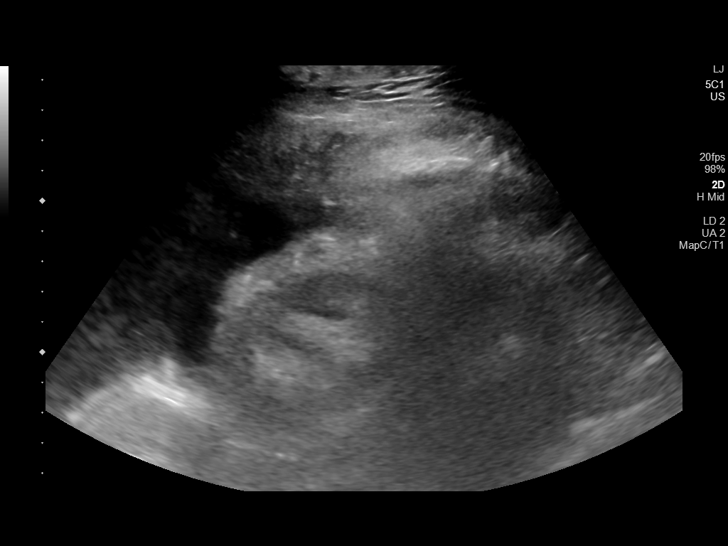
[im 5/10]
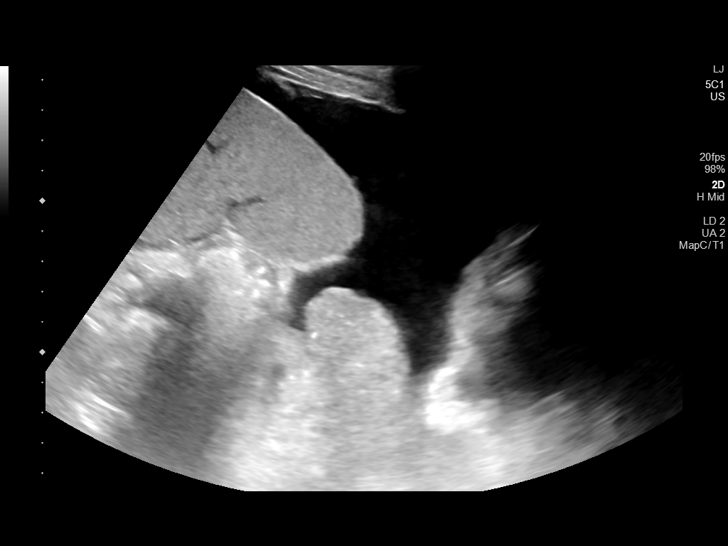
[im 6/10]
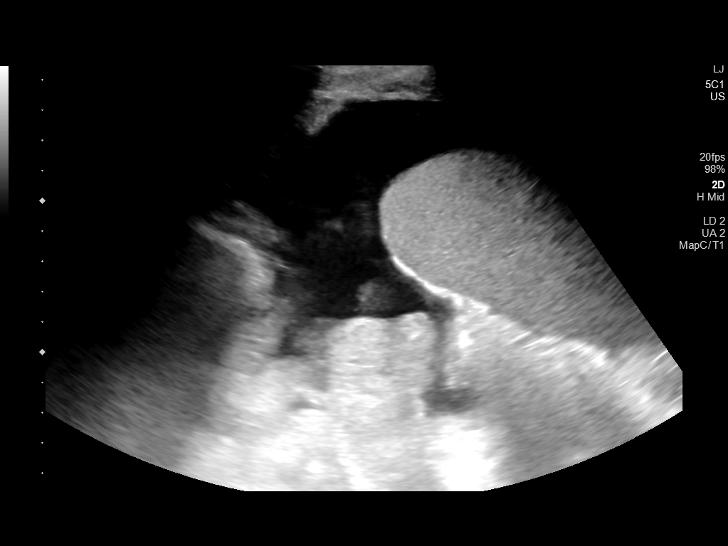
[im 7/10]
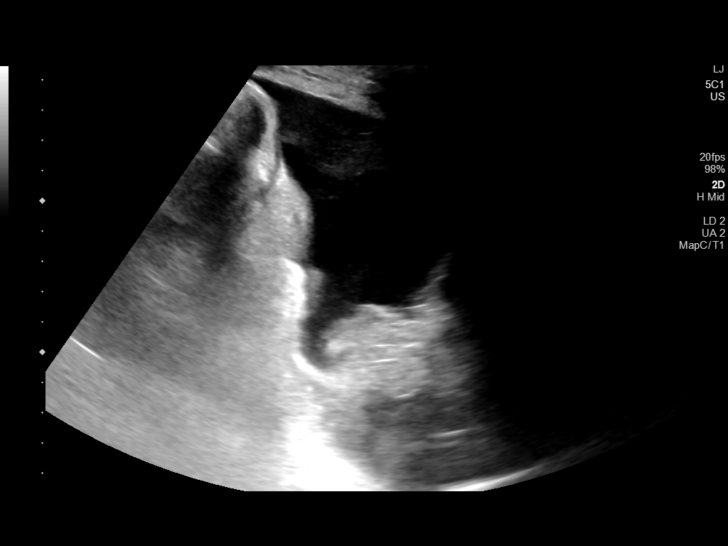
[im 8/10]
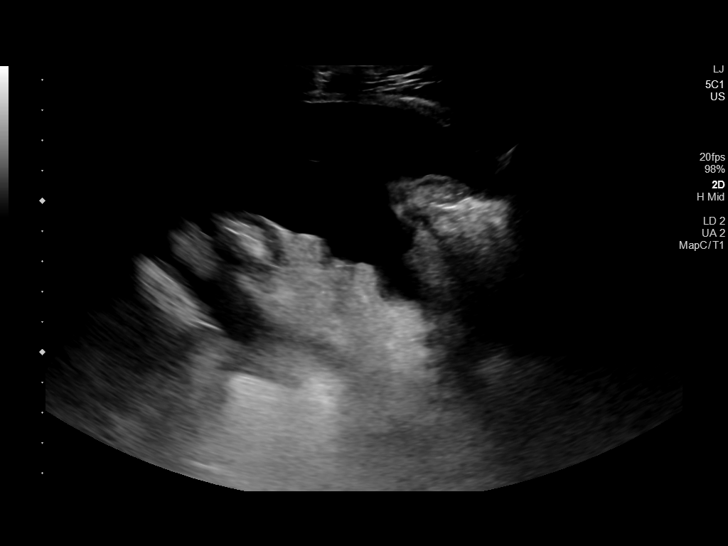
[im 9/10]
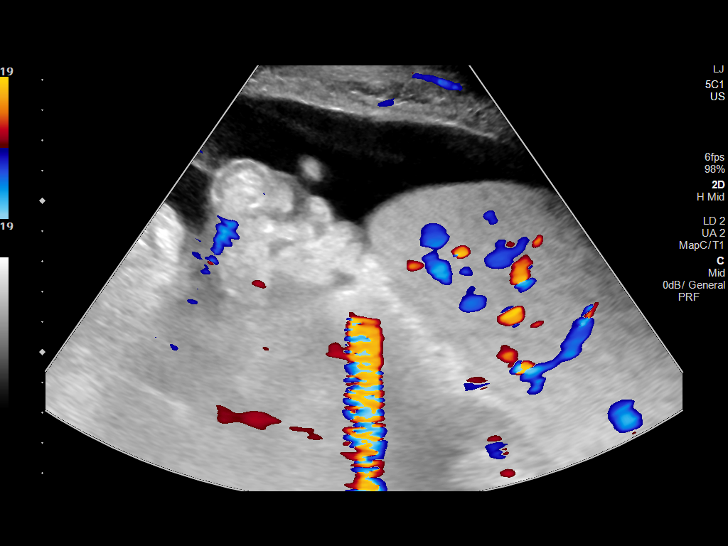
[im 10/10]
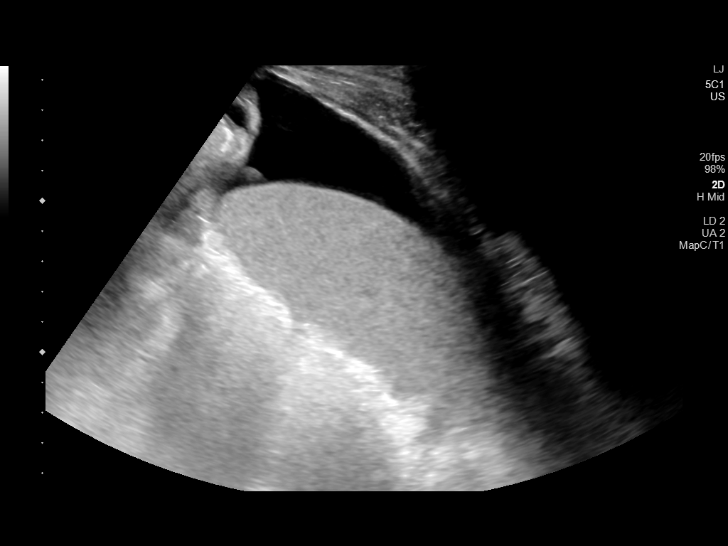

[10 of 10 positions shown; findings below may reference images not displayed]

EXAM:
ULTRASOUND GUIDED  PARACENTESIS

MEDICATIONS:
10 mL 1% lidocaine

COMPLICATIONS:
None immediate.

PROCEDURE:
Informed written consent was obtained from the patient after a
discussion of the risks, benefits and alternatives to treatment. A
timeout was performed prior to the initiation of the procedure.

Initial ultrasound scanning demonstrates a moderate amount of
ascites within the left lower abdominal quadrant. The left lower
abdomen was prepped and draped in the usual sterile fashion. 1%
lidocaine was used for local anesthesia.

Following this, a 19 gauge, 7-cm, Yueh catheter was introduced. An
ultrasound image was saved for documentation purposes. The
paracentesis was performed. The catheter was removed and a dressing
was applied. The patient tolerated the procedure well without
immediate post procedural complication.
FINDINGS: A total of approximately 2.4 L of clear dark yellow fluid was
removed.
IMPRESSION: Successful ultrasound-guided paracentesis yielding 2.4 liters of
peritoneal fluid.

## 2021-08-10 MED ORDER — LIDOCAINE HCL 1 % IJ SOLN
INTRAMUSCULAR | Status: AC
  Administered 2021-08-10: 12:00:00 10 mL
  Filled 2021-08-10: qty 20

## 2021-08-10 NOTE — Procedures (Signed)
PROCEDURE SUMMARY:  Successful image-guided paracentesis from the left lower abdomen.  Yielded 2.4 liters of clear dark yellow fluid.  No immediate complications.  EBL = trace. Patient tolerated well.   Specimen was not sent for labs.  Please see imaging section of Epic for full dictation.   Lynann Bologna Caylin Nass PA-C 08/10/2021 11:14 AM
